# Patient Record
Sex: Female | Born: 1980 | Race: White | Hispanic: No | Marital: Single | Smoking: Never smoker
Health system: Southern US, Community
[De-identification: ages and names within clinical notes are randomized; demographics above are authoritative.]

## PROBLEM LIST (undated history)

## (undated) DIAGNOSIS — F419 Anxiety disorder, unspecified: Secondary | ICD-10-CM

---
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
## 1998-01-28 ENCOUNTER — Emergency Department (HOSPITAL_COMMUNITY): Admission: EM | Admit: 1998-01-28 | Discharge: 1998-01-28 | Payer: Self-pay | Admitting: Emergency Medicine

## 1998-02-05 ENCOUNTER — Emergency Department (HOSPITAL_COMMUNITY): Admission: EM | Admit: 1998-02-05 | Discharge: 1998-02-05 | Payer: Self-pay | Admitting: Emergency Medicine

## 1998-10-26 ENCOUNTER — Emergency Department (HOSPITAL_COMMUNITY): Admission: EM | Admit: 1998-10-26 | Discharge: 1998-10-26 | Payer: Self-pay | Admitting: Emergency Medicine

## 1998-12-30 ENCOUNTER — Emergency Department (HOSPITAL_COMMUNITY): Admission: EM | Admit: 1998-12-30 | Discharge: 1998-12-30 | Payer: Self-pay | Admitting: Emergency Medicine

## 1998-12-30 ENCOUNTER — Encounter: Payer: Self-pay | Admitting: Emergency Medicine

## 1999-01-11 ENCOUNTER — Emergency Department (HOSPITAL_COMMUNITY): Admission: EM | Admit: 1999-01-11 | Discharge: 1999-01-11 | Payer: Self-pay | Admitting: Emergency Medicine

## 1999-02-12 ENCOUNTER — Emergency Department (HOSPITAL_COMMUNITY): Admission: EM | Admit: 1999-02-12 | Discharge: 1999-02-12 | Payer: Self-pay | Admitting: *Deleted

## 1999-02-16 ENCOUNTER — Emergency Department (HOSPITAL_COMMUNITY): Admission: EM | Admit: 1999-02-16 | Discharge: 1999-02-16 | Payer: Self-pay | Admitting: Emergency Medicine

## 1999-04-21 ENCOUNTER — Emergency Department (HOSPITAL_COMMUNITY): Admission: EM | Admit: 1999-04-21 | Discharge: 1999-04-21 | Payer: Self-pay | Admitting: Emergency Medicine

## 1999-04-23 ENCOUNTER — Emergency Department (HOSPITAL_COMMUNITY): Admission: EM | Admit: 1999-04-23 | Discharge: 1999-04-23 | Payer: Self-pay | Admitting: Emergency Medicine

## 1999-07-28 ENCOUNTER — Emergency Department (HOSPITAL_COMMUNITY): Admission: EM | Admit: 1999-07-28 | Discharge: 1999-07-28 | Payer: Self-pay | Admitting: Emergency Medicine

## 1999-08-28 ENCOUNTER — Emergency Department (HOSPITAL_COMMUNITY): Admission: EM | Admit: 1999-08-28 | Discharge: 1999-08-28 | Payer: Self-pay | Admitting: Emergency Medicine

## 1999-09-16 ENCOUNTER — Emergency Department (HOSPITAL_COMMUNITY): Admission: EM | Admit: 1999-09-16 | Discharge: 1999-09-16 | Payer: Self-pay | Admitting: Emergency Medicine

## 2000-04-16 ENCOUNTER — Emergency Department (HOSPITAL_COMMUNITY): Admission: EM | Admit: 2000-04-16 | Discharge: 2000-04-16 | Payer: Self-pay | Admitting: Emergency Medicine

## 2000-04-16 ENCOUNTER — Encounter: Payer: Self-pay | Admitting: Emergency Medicine

## 2000-08-29 ENCOUNTER — Emergency Department (HOSPITAL_COMMUNITY): Admission: EM | Admit: 2000-08-29 | Discharge: 2000-08-29 | Payer: Self-pay | Admitting: Emergency Medicine

## 2000-12-01 ENCOUNTER — Other Ambulatory Visit: Admission: RE | Admit: 2000-12-01 | Discharge: 2000-12-01 | Payer: Self-pay | Admitting: Physician Assistant

## 2000-12-18 ENCOUNTER — Inpatient Hospital Stay (HOSPITAL_COMMUNITY): Admission: AD | Admit: 2000-12-18 | Discharge: 2000-12-18 | Payer: Self-pay | Admitting: Obstetrics & Gynecology

## 2001-02-15 ENCOUNTER — Ambulatory Visit (HOSPITAL_COMMUNITY): Admission: RE | Admit: 2001-02-15 | Discharge: 2001-02-15 | Payer: Self-pay | Admitting: *Deleted

## 2001-03-04 ENCOUNTER — Inpatient Hospital Stay (HOSPITAL_COMMUNITY): Admission: AD | Admit: 2001-03-04 | Discharge: 2001-03-04 | Payer: Self-pay | Admitting: Obstetrics & Gynecology

## 2001-04-25 ENCOUNTER — Inpatient Hospital Stay (HOSPITAL_COMMUNITY): Admission: AD | Admit: 2001-04-25 | Discharge: 2001-04-25 | Payer: Self-pay | Admitting: *Deleted

## 2001-04-28 ENCOUNTER — Inpatient Hospital Stay (HOSPITAL_COMMUNITY): Admission: AD | Admit: 2001-04-28 | Discharge: 2001-05-01 | Payer: Self-pay | Admitting: *Deleted

## 2001-04-28 ENCOUNTER — Ambulatory Visit (HOSPITAL_COMMUNITY): Admission: RE | Admit: 2001-04-28 | Discharge: 2001-04-28 | Payer: Self-pay | Admitting: *Deleted

## 2001-07-08 ENCOUNTER — Emergency Department (HOSPITAL_COMMUNITY): Admission: EM | Admit: 2001-07-08 | Discharge: 2001-07-08 | Payer: Self-pay | Admitting: Emergency Medicine

## 2001-08-19 ENCOUNTER — Emergency Department (HOSPITAL_COMMUNITY): Admission: EM | Admit: 2001-08-19 | Discharge: 2001-08-19 | Payer: Self-pay | Admitting: Emergency Medicine

## 2001-09-23 ENCOUNTER — Emergency Department (HOSPITAL_COMMUNITY): Admission: EM | Admit: 2001-09-23 | Discharge: 2001-09-23 | Payer: Self-pay | Admitting: Emergency Medicine

## 2001-10-07 ENCOUNTER — Emergency Department (HOSPITAL_COMMUNITY): Admission: EM | Admit: 2001-10-07 | Discharge: 2001-10-08 | Payer: Self-pay | Admitting: Emergency Medicine

## 2002-01-01 ENCOUNTER — Emergency Department (HOSPITAL_COMMUNITY): Admission: EM | Admit: 2002-01-01 | Discharge: 2002-01-01 | Payer: Self-pay | Admitting: *Deleted

## 2005-04-26 ENCOUNTER — Emergency Department (HOSPITAL_COMMUNITY): Admission: EM | Admit: 2005-04-26 | Discharge: 2005-04-26 | Payer: Self-pay | Admitting: Emergency Medicine

## 2005-04-27 ENCOUNTER — Emergency Department (HOSPITAL_COMMUNITY): Admission: EM | Admit: 2005-04-27 | Discharge: 2005-04-27 | Payer: Self-pay | Admitting: Emergency Medicine

## 2005-05-01 ENCOUNTER — Emergency Department (HOSPITAL_COMMUNITY): Admission: EM | Admit: 2005-05-01 | Discharge: 2005-05-02 | Payer: Self-pay | Admitting: Emergency Medicine

## 2005-06-07 ENCOUNTER — Inpatient Hospital Stay (HOSPITAL_COMMUNITY): Admission: AD | Admit: 2005-06-07 | Discharge: 2005-06-07 | Payer: Self-pay | Admitting: Obstetrics & Gynecology

## 2005-08-13 ENCOUNTER — Inpatient Hospital Stay (HOSPITAL_COMMUNITY): Admission: AD | Admit: 2005-08-13 | Discharge: 2005-08-13 | Payer: Self-pay | Admitting: Obstetrics

## 2005-08-14 ENCOUNTER — Inpatient Hospital Stay (HOSPITAL_COMMUNITY): Admission: AD | Admit: 2005-08-14 | Discharge: 2005-08-14 | Payer: Self-pay | Admitting: Obstetrics & Gynecology

## 2005-09-21 ENCOUNTER — Inpatient Hospital Stay (HOSPITAL_COMMUNITY): Admission: AD | Admit: 2005-09-21 | Discharge: 2005-09-22 | Payer: Self-pay | Admitting: Obstetrics & Gynecology

## 2005-09-22 ENCOUNTER — Emergency Department (HOSPITAL_COMMUNITY): Admission: EM | Admit: 2005-09-22 | Discharge: 2005-09-22 | Payer: Self-pay | Admitting: Emergency Medicine

## 2005-10-22 ENCOUNTER — Inpatient Hospital Stay (HOSPITAL_COMMUNITY): Admission: AD | Admit: 2005-10-22 | Discharge: 2005-10-22 | Payer: Self-pay | Admitting: Obstetrics & Gynecology

## 2005-12-02 ENCOUNTER — Ambulatory Visit (HOSPITAL_COMMUNITY): Admission: RE | Admit: 2005-12-02 | Discharge: 2005-12-02 | Payer: Self-pay | Admitting: Obstetrics

## 2005-12-11 ENCOUNTER — Inpatient Hospital Stay (HOSPITAL_COMMUNITY): Admission: AD | Admit: 2005-12-11 | Discharge: 2005-12-11 | Payer: Self-pay | Admitting: Obstetrics

## 2006-01-12 ENCOUNTER — Inpatient Hospital Stay (HOSPITAL_COMMUNITY): Admission: AD | Admit: 2006-01-12 | Discharge: 2006-01-12 | Payer: Self-pay | Admitting: Obstetrics

## 2006-03-17 ENCOUNTER — Inpatient Hospital Stay (HOSPITAL_COMMUNITY): Admission: AD | Admit: 2006-03-17 | Discharge: 2006-03-17 | Payer: Self-pay | Admitting: Obstetrics

## 2006-03-26 ENCOUNTER — Inpatient Hospital Stay (HOSPITAL_COMMUNITY): Admission: AD | Admit: 2006-03-26 | Discharge: 2006-03-26 | Payer: Self-pay | Admitting: Obstetrics

## 2006-04-05 ENCOUNTER — Inpatient Hospital Stay (HOSPITAL_COMMUNITY): Admission: AD | Admit: 2006-04-05 | Discharge: 2006-04-08 | Payer: Self-pay | Admitting: Obstetrics & Gynecology

## 2006-06-10 ENCOUNTER — Emergency Department (HOSPITAL_COMMUNITY): Admission: EM | Admit: 2006-06-10 | Discharge: 2006-06-10 | Payer: Self-pay | Admitting: Emergency Medicine

## 2006-08-10 ENCOUNTER — Inpatient Hospital Stay (HOSPITAL_COMMUNITY): Admission: AD | Admit: 2006-08-10 | Discharge: 2006-08-10 | Payer: Self-pay | Admitting: Obstetrics

## 2006-10-18 ENCOUNTER — Inpatient Hospital Stay (HOSPITAL_COMMUNITY): Admission: AD | Admit: 2006-10-18 | Discharge: 2006-10-18 | Payer: Self-pay | Admitting: Obstetrics and Gynecology

## 2006-10-22 ENCOUNTER — Inpatient Hospital Stay (HOSPITAL_COMMUNITY): Admission: AD | Admit: 2006-10-22 | Discharge: 2006-10-22 | Payer: Self-pay | Admitting: Gynecology

## 2009-04-15 ENCOUNTER — Emergency Department (HOSPITAL_COMMUNITY): Admission: EM | Admit: 2009-04-15 | Discharge: 2009-04-16 | Payer: Self-pay | Admitting: Emergency Medicine

## 2010-05-24 ENCOUNTER — Emergency Department (HOSPITAL_BASED_OUTPATIENT_CLINIC_OR_DEPARTMENT_OTHER)
Admission: EM | Admit: 2010-05-24 | Discharge: 2010-05-24 | Disposition: A | Discharge status: Home / Self Care | Payer: Medicaid Other | Attending: Emergency Medicine | Admitting: Emergency Medicine

## 2010-05-24 DIAGNOSIS — J029 Acute pharyngitis, unspecified: Secondary | ICD-10-CM | POA: Insufficient documentation

## 2010-05-24 DIAGNOSIS — R059 Cough, unspecified: Secondary | ICD-10-CM | POA: Insufficient documentation

## 2010-05-24 DIAGNOSIS — R05 Cough: Secondary | ICD-10-CM | POA: Insufficient documentation

## 2010-06-06 LAB — CBC
Hemoglobin: 13.7 g/dL (ref 12.0–15.0)
Platelets: 234 10*3/uL (ref 150–400)

## 2010-06-06 LAB — BASIC METABOLIC PANEL
BUN: 6 mg/dL (ref 6–23)
CO2: 24 mEq/L (ref 19–32)
Chloride: 103 mEq/L (ref 96–112)
GFR calc Af Amer: >60 mL/min (ref >60)
Glucose, Bld: 121 mg/dL — ABNORMAL HIGH (ref 70–99)
Potassium: 3.4 mEq/L — ABNORMAL LOW (ref 3.5–5.1)

## 2010-06-06 LAB — DIFFERENTIAL
Basophils Absolute: 0 10*3/uL (ref 0.0–0.1)
Eosinophils Absolute: 0 10*3/uL (ref 0.0–0.7)
Lymphocytes Relative: 7 % — ABNORMAL LOW (ref 12–46)
Neutro Abs: 13.2 10*3/uL — ABNORMAL HIGH (ref 1.7–7.7)
Neutrophils Relative %: 87 % — ABNORMAL HIGH (ref 43–77)

## 2010-06-06 LAB — HEPATIC FUNCTION PANEL
AST: 73 U/L — ABNORMAL HIGH (ref 0–37)
Albumin: 3.9 g/dL (ref 3.5–5.2)
Bilirubin, Direct: 0.2 mg/dL (ref 0.0–0.3)
Indirect Bilirubin: 0.5 mg/dL (ref 0.3–0.9)

## 2010-06-06 LAB — LIPASE, BLOOD: Lipase: 23 U/L (ref 11–59)

## 2010-06-06 LAB — POCT CARDIAC MARKERS

## 2010-08-01 NOTE — H&P (Signed)
NAMEDEVIKA, DRAGOVICH                 ACCOUNT NO.:  [id number]   MEDICAL RECORD NO.:  [id number]          PATIENT TYPE:  INP   LOCATION:  9164                          FACILITY:  [facility]   PHYSICIAN:  Roseanna Rainbow, M.D.DATE OF BIRTH:  08-Sep-1980   DATE OF ADMISSION:  04/05/2006  DATE OF DISCHARGE:                              HISTORY & PHYSICAL   CHIEF COMPLAINT:  The patient is a 30 year old, gravida 4, para 2 with  an estimated date of confinement of January 28 with an intrauterine  pregnancy at 39 weeks complaining of uterine contractions.   HISTORY OF PRESENT ILLNESS:  Please see the above.   ALLERGIES:  NO KNOWN DRUG ALLERGIES.   MEDICATIONS:  Please see the reconciliation form.   OB RISK FACTORS:  Urinary tract infection, threatened preterm labor.   PRENATAL LABS:  Hematocrit 32.7, hemoglobin 11, platelets 211,000.  Chlamydia probe negative, GC probe negative, urine culture and  sensitivity no growth in June of 2007, one-hour GCT 103, hepatitis B  surface antigen negative, HIV negative, blood type O positive, antibody  screen negative, RPR nonreactive, rubella immune.  GBS is negative on  January 9th.   PAST GYN HISTORY:  Chlamydia, recurrent urinary tract infections.   PAST MEDICAL HISTORY:  Please see the above, migraine headaches.  She  has a history of anxiety disorder.   PAST SURGICAL HISTORY:  She denies.   SOCIAL HISTORY:  She is a Child psychotherapist, single.  Does not give any  significant history of alcohol use.  She has no significant smoking  history.  Denies illicit drug use.   FAMILY HISTORY:  Chronic alcoholism.   PAST OBSTETRICAL HISTORY:  In February of 2003, she was delivered of a  liveborn female, 8 pounds 2 ounces, vaginal delivery, no complications.  In May of 1999, she was delivered a liveborn female, 6 pounds 14 ounces,  vaginal delivery, no complications.  There is a history of one  miscarriage or abortion.  There is a history of a voluntary  termination  of pregnancy.   PHYSICAL EXAMINATION:  VITAL SIGNS:  Temperature 97.9, pulse 103,  respiratory rate 20, blood pressure 131/56.  GENERAL:  Well developed, well nourished, in no apparent distress.  Fetal heart tracing reassuring.  Tocodynamometer irregular uterine  contractions.  PELVIC:  Sterile vaginal exam per the RN.  Cervix is 4 cm dilated, 50%  effaced.   ASSESSMENT:  Multipara at term, likely latent phase of labor.  Fetal  heart tracing consistent with fetal well being.   PLAN:  Admission, expectant management.      Roseanna Rainbow, M.D.  Electronically Signed     LAJ/MEDQ  D:  04/05/2006  T:  04/05/2006  Job:  [id number]

## 2010-09-02 ENCOUNTER — Emergency Department (HOSPITAL_BASED_OUTPATIENT_CLINIC_OR_DEPARTMENT_OTHER)
Admission: EM | Admit: 2010-09-02 | Discharge: 2010-09-02 | Disposition: A | Discharge status: Home / Self Care | Payer: Medicaid Other | Attending: Emergency Medicine | Admitting: Emergency Medicine

## 2010-09-02 DIAGNOSIS — B9789 Other viral agents as the cause of diseases classified elsewhere: Secondary | ICD-10-CM | POA: Insufficient documentation

## 2010-09-02 DIAGNOSIS — B084 Enteroviral vesicular stomatitis with exanthem: Secondary | ICD-10-CM | POA: Insufficient documentation

## 2010-12-18 ENCOUNTER — Emergency Department (HOSPITAL_BASED_OUTPATIENT_CLINIC_OR_DEPARTMENT_OTHER)
Admission: EM | Admit: 2010-12-18 | Discharge: 2010-12-19 | Disposition: A | Discharge status: Home / Self Care | Payer: Medicaid Other | Attending: Emergency Medicine | Admitting: Emergency Medicine

## 2010-12-18 ENCOUNTER — Encounter: Payer: Self-pay | Admitting: Emergency Medicine

## 2010-12-18 ENCOUNTER — Emergency Department (INDEPENDENT_AMBULATORY_CARE_PROVIDER_SITE_OTHER): Payer: Medicaid Other

## 2010-12-18 DIAGNOSIS — R05 Cough: Secondary | ICD-10-CM

## 2010-12-18 DIAGNOSIS — J3489 Other specified disorders of nose and nasal sinuses: Secondary | ICD-10-CM

## 2010-12-18 DIAGNOSIS — R059 Cough, unspecified: Secondary | ICD-10-CM | POA: Insufficient documentation

## 2010-12-18 DIAGNOSIS — R0609 Other forms of dyspnea: Secondary | ICD-10-CM

## 2010-12-18 DIAGNOSIS — J069 Acute upper respiratory infection, unspecified: Secondary | ICD-10-CM | POA: Insufficient documentation

## 2010-12-18 MED ORDER — ACETAMINOPHEN-CODEINE 120-12 MG/5ML PO SUSP: 5.0000 mL | Freq: Four times a day (QID) | ORAL | Status: AC | PRN

## 2010-12-18 MED ORDER — IBUPROFEN 800 MG PO TABS: 800.0000 mg | ORAL_TABLET | Freq: Three times a day (TID) | ORAL | Status: AC

## 2010-12-18 MED ORDER — ALBUTEROL SULFATE HFA 108 (90 BASE) MCG/ACT IN AERS: 1.0000 | INHALATION_SPRAY | Freq: Four times a day (QID) | RESPIRATORY_TRACT | Status: DC | PRN

## 2010-12-18 MED ORDER — ALBUTEROL SULFATE HFA 108 (90 BASE) MCG/ACT IN AERS
2.0000 | INHALATION_SPRAY | Freq: Once | RESPIRATORY_TRACT | Status: AC
  Administered 2010-12-18: 2 via RESPIRATORY_TRACT

## 2010-12-18 MED ORDER — ALBUTEROL SULFATE HFA 108 (90 BASE) MCG/ACT IN AERS
INHALATION_SPRAY | RESPIRATORY_TRACT | Status: AC
  Administered 2010-12-18: 2 via RESPIRATORY_TRACT
  Filled 2010-12-18: qty 6.7

## 2010-12-18 NOTE — ED Provider Notes (Signed)
History     CSN: [id number] Arrival date & time: 12/18/2010  9:48 PM  Chief Complaint  Patient presents with  . Cough  . Nasal Congestion    (Consider location/radiation/quality/duration/timing/severity/associated sxs/prior treatment) Patient is a 30 y.o. female presenting with cough. The history is provided by the patient.  Cough This is a new problem. Associated symptoms include sore throat. Pertinent negatives include no chest pain, no chills, no headaches, no shortness of breath and no wheezing.   dry cough started 2-3 days ago with no sick contacts at home. No fevers or chills. Does have some congestion and and mild sore throat. No chest pain no shortness of breath location is in her chest no radiation. Timing has been continuous and worse at night causing some difficulty sleeping that's why she presents here. Severity is moderate no associated rash. no recent travel.   Past Medical History  Diagnosis Date  . Pneumonia     History reviewed. No pertinent past surgical history.  History reviewed. No pertinent family history.  History  Substance Use Topics  . Smoking status: Former Games developer  . Smokeless tobacco: Not on file  . Alcohol Use: No    OB History    Grav Para Term Preterm Abortions TAB SAB Ect Mult Living                  Review of Systems  Constitutional: Negative for fever and chills.  HENT: Positive for congestion and sore throat. Negative for drooling, trouble swallowing, neck pain, neck stiffness and voice change.   Eyes: Negative for pain.  Respiratory: Positive for cough. Negative for shortness of breath and wheezing.   Cardiovascular: Negative for chest pain.  Gastrointestinal: Negative for abdominal pain.  Genitourinary: Negative for dysuria.  Musculoskeletal: Negative for back pain.  Skin: Negative for rash.  Neurological: Negative for headaches.  All other systems reviewed and are negative.    Allergies  Review of patient's allergies  indicates no known allergies.  Home Medications   Current Outpatient Rx  Name Route Sig Dispense Refill  . ACETAMINOPHEN 500 MG PO TABS Oral Take 1,000 mg by mouth every 6 (six) hours as needed. For pain     . PSEUDOEPH-DOXYLAMINE-DM-APAP 60-7.08-12-998 MG/30ML PO LIQD Oral Take 25 mLs by mouth at bedtime as needed. For cold       BP 106/75  Pulse 91  Temp(Src) 97.8 F (36.6 C) (Oral)  Resp 18  SpO2 100%  LMP 12/17/2010  Physical Exam  Constitutional: She is oriented to person, place, and time. She appears well-developed and well-nourished.  HENT:  Head: Normocephalic and atraumatic.  Eyes: Conjunctivae and EOM are normal. Pupils are equal, round, and reactive to light.       No tonsillar enlargement or exudates, uvula midline  Neck: Trachea normal. Neck supple. No thyromegaly present.  Cardiovascular: Normal rate, regular rhythm, S1 normal, S2 normal and normal pulses.     No systolic murmur is present   No diastolic murmur is present  Pulses:      Radial pulses are 2+ on the right side, and 2+ on the left side.  Pulmonary/Chest: Effort normal and breath sounds normal. She has no wheezes. She has no rhonchi. She has no rales. She exhibits no tenderness.       Good air movement no respiratory distress.  Abdominal: Soft. Normal appearance and bowel sounds are normal. There is no tenderness. There is no CVA tenderness and negative Murphy's sign.  Musculoskeletal:  BLE:s Calves nontender, no cords or erythema, negative Homans sign  Neurological: She is alert and oriented to person, place, and time. She has normal strength. No cranial nerve deficit or sensory deficit. GCS eye subscore is 4. GCS verbal subscore is 5. GCS motor subscore is 6.  Skin: Skin is warm and dry. No rash noted. She is not diaphoretic.  Psychiatric: Her speech is normal.       Cooperative and appropriate    ED Course  Procedures (including critical care time)  Labs Reviewed - No data to display Dg  Chest 2 View  12/18/2010  *RADIOLOGY REPORT*  Clinical Data: Cough and nasal congestion with difficulty breathing for 1 week.  CHEST - 2 VIEW  Comparison: 04/16/2009  Findings: Normal heart size and pulmonary vascularity.  No focal airspace consolidation in the lungs.  No blunting of costophrenic angles.  No pneumothorax.  Mild thoracolumbar scoliosis.  No significant changes since the previous study.  IMPRESSION: No evidence of active pulmonary disease.  No significant interval change.  Original Report Authenticated By: Marlon Pel, M.D.     No diagnosis found.    MDM   Presentation consistent with viral URI. Patient was given albuterol 2 puffs which did seem to help her symptoms some so she was sent home with an inhaler. Chest x-ray was reviewed as above no pneumonia. Prescription for cough and high-dose anti-inflammatories and plan the primary care followup as needed.        Sunnie Nielsen, MD 12/18/10 2350

## 2010-12-18 NOTE — ED Notes (Signed)
Pt reports cough and congestion since mon. Pt c/o shob over last day

## 2010-12-19 NOTE — ED Notes (Signed)
Care plan and use of meds reviewed pt verbalizes plan well

## 2010-12-29 LAB — URINALYSIS, ROUTINE W REFLEX MICROSCOPIC
Glucose, UA: NEGATIVE
Ketones, ur: 40 — AB
Ketones, ur: NEGATIVE
Leukocytes, UA: NEGATIVE
Nitrite: NEGATIVE
Protein, ur: NEGATIVE
Specific Gravity, Urine: >1.03 — ABNORMAL HIGH
Urobilinogen, UA: 0.2
pH: 6

## 2010-12-29 LAB — DIFFERENTIAL
Basophils Absolute: 0
Eosinophils Absolute: 0.1
Eosinophils Relative: 1
Lymphs Abs: 2
Monocytes Absolute: 0.5
Monocytes Relative: 4
Neutrophils Relative %: 77

## 2010-12-29 LAB — TYPE AND SCREEN: ABO/RH(D): O POS

## 2010-12-29 LAB — URINE MICROSCOPIC-ADD ON

## 2010-12-29 LAB — WET PREP, GENITAL
Trich, Wet Prep: NONE SEEN
Yeast Wet Prep HPF POC: NONE SEEN

## 2010-12-29 LAB — GC/CHLAMYDIA PROBE AMP, GENITAL
Chlamydia, DNA Probe: NEGATIVE
GC Probe Amp, Genital: POSITIVE — AB

## 2010-12-29 LAB — HIV ANTIBODY (ROUTINE TESTING W REFLEX): HIV: NONREACTIVE

## 2010-12-29 LAB — CBC
MCHC: 35
MCV: 90.8
RBC: 3.62 — ABNORMAL LOW
RDW: 14.6 — ABNORMAL HIGH
WBC: 11.9 — ABNORMAL HIGH

## 2011-06-10 ENCOUNTER — Encounter (HOSPITAL_BASED_OUTPATIENT_CLINIC_OR_DEPARTMENT_OTHER): Payer: Self-pay | Admitting: *Deleted

## 2011-06-10 ENCOUNTER — Emergency Department (HOSPITAL_BASED_OUTPATIENT_CLINIC_OR_DEPARTMENT_OTHER)
Admission: EM | Admit: 2011-06-10 | Discharge: 2011-06-10 | Disposition: A | Discharge status: Home / Self Care | Payer: Medicaid Other | Attending: Emergency Medicine | Admitting: Emergency Medicine

## 2011-06-10 DIAGNOSIS — R05 Cough: Secondary | ICD-10-CM | POA: Insufficient documentation

## 2011-06-10 DIAGNOSIS — R51 Headache: Secondary | ICD-10-CM | POA: Insufficient documentation

## 2011-06-10 DIAGNOSIS — J029 Acute pharyngitis, unspecified: Secondary | ICD-10-CM | POA: Insufficient documentation

## 2011-06-10 DIAGNOSIS — J069 Acute upper respiratory infection, unspecified: Secondary | ICD-10-CM | POA: Insufficient documentation

## 2011-06-10 DIAGNOSIS — J3489 Other specified disorders of nose and nasal sinuses: Secondary | ICD-10-CM | POA: Insufficient documentation

## 2011-06-10 DIAGNOSIS — R059 Cough, unspecified: Secondary | ICD-10-CM | POA: Insufficient documentation

## 2011-06-10 NOTE — ED Notes (Signed)
Pt amb to triage with quick steady gait in nad. Pt reports cough, congestion, headache x Saturday.

## 2011-06-10 NOTE — Discharge Instructions (Signed)

## 2011-06-10 NOTE — ED Provider Notes (Signed)
History     CSN: [id number]  Arrival date & time 06/10/11  1737   First MD Initiated Contact with Patient 06/10/11 1855      Chief Complaint  Patient presents with  . Cough  . Nasal Congestion    (Consider location/radiation/quality/duration/timing/severity/associated sxs/prior treatment) HPI Comments: Patient presents with cough and cold symptoms since Saturday.  She notes that she just feels like they're not getting better.  She's used Tylenol and ibuprofen for her associated headache.  No nausea and vomiting.  Patient notes a mild sore throat and congestion and sinus pressure.  No shortness of breath or wheezing.  Patient is a 31 y.o. female presenting with cough. The history is provided by the patient. No language interpreter was used.  Cough This is a new problem. The current episode started more than 2 days ago. The problem occurs every few minutes. The problem has not changed since onset.The cough is non-productive. There has been no fever. Associated symptoms include headaches, rhinorrhea and sore throat. Pertinent negatives include no chest pain, no chills, no shortness of breath and no eye redness.    Past Medical History  Diagnosis Date  . Pneumonia     History reviewed. No pertinent past surgical history.  History reviewed. No pertinent family history.  History  Substance Use Topics  . Smoking status: Former Games developer  . Smokeless tobacco: Not on file  . Alcohol Use: No    OB History    Grav Para Term Preterm Abortions TAB SAB Ect Mult Living                  Review of Systems  Constitutional: Negative.  Negative for fever and chills.  HENT: Positive for sore throat and rhinorrhea.   Eyes: Negative.  Negative for discharge and redness.  Respiratory: Positive for cough. Negative for shortness of breath.   Cardiovascular: Negative.  Negative for chest pain.  Gastrointestinal: Negative.  Negative for nausea, vomiting, abdominal pain and diarrhea.    Genitourinary: Negative.  Negative for dysuria and vaginal discharge.  Musculoskeletal: Negative.  Negative for back pain.  Skin: Negative.  Negative for color change and rash.  Neurological: Positive for headaches. Negative for syncope.  Hematological: Negative.  Negative for adenopathy.  Psychiatric/Behavioral: Negative.  Negative for confusion.  All other systems reviewed and are negative.    Allergies  Review of patient's allergies indicates no known allergies.  Home Medications   Current Outpatient Rx  Name Route Sig Dispense Refill  . ACETAMINOPHEN 500 MG PO TABS Oral Take 1,000 mg by mouth every 6 (six) hours as needed. For pain     . ALBUTEROL SULFATE HFA 108 (90 BASE) MCG/ACT IN AERS Inhalation Inhale 1-2 puffs into the lungs every 6 (six) hours as needed for wheezing. 1 Inhaler 0  . PSEUDOEPH-DOXYLAMINE-DM-APAP 60-7.08-12-998 MG/30ML PO LIQD Oral Take 25 mLs by mouth at bedtime as needed. For cold       BP 103/65  Pulse 95  Temp(Src) 98.1 F (36.7 C) (Oral)  Resp 18  Ht 5\' 3"  (1.6 m)  SpO2 100%  LMP 05/19/2011  Physical Exam  Nursing note and vitals reviewed. Constitutional: She is oriented to person, place, and time. She appears well-developed and well-nourished.  Non-toxic appearance. She does not have a sickly appearance.  HENT:  Head: Normocephalic and atraumatic.  Mouth/Throat: Oropharynx is clear and moist. No oropharyngeal exudate.  Eyes: Conjunctivae, EOM and lids are normal. Pupils are equal, round, and reactive to light. No scleral  icterus.  Neck: Trachea normal and normal range of motion. Neck supple.  Cardiovascular: Normal rate, regular rhythm and normal heart sounds.   Pulmonary/Chest: Effort normal and breath sounds normal. No respiratory distress. She has no wheezes. She has no rales.  Abdominal: Soft. Normal appearance. There is no tenderness. There is no rebound, no guarding and no CVA tenderness.  Musculoskeletal: Normal range of motion.   Lymphadenopathy:    She has no cervical adenopathy.  Neurological: She is alert and oriented to person, place, and time. She has normal strength.  Skin: Skin is warm, dry and intact. No rash noted.  Psychiatric: She has a normal mood and affect. Her behavior is normal. Judgment and thought content normal.    ED Course  Procedures (including critical care time)  Labs Reviewed - No data to display No results found.   No diagnosis found.    MDM  Patient with likely viral URI.  Her lungs are clear and show no indication of pneumonia.  Her throat is clear and shows no signs of erythema or swelling to suggest strep pharyngitis.  I've advised the patient to continue using Tylenol, or ibuprofen and over-the-counter cold medicines for her symptoms.        Nat Christen, MD 06/10/11 [phone]

## 2011-10-06 ENCOUNTER — Emergency Department (HOSPITAL_COMMUNITY): Admission: EM | Admit: 2011-10-06 | Discharge: 2011-10-06 | Payer: Self-pay

## 2011-12-28 ENCOUNTER — Emergency Department (HOSPITAL_BASED_OUTPATIENT_CLINIC_OR_DEPARTMENT_OTHER)
Admission: EM | Admit: 2011-12-28 | Discharge: 2011-12-28 | Disposition: A | Discharge status: Home / Self Care | Payer: Medicaid Other | Attending: Emergency Medicine | Admitting: Emergency Medicine

## 2011-12-28 ENCOUNTER — Encounter (HOSPITAL_BASED_OUTPATIENT_CLINIC_OR_DEPARTMENT_OTHER): Payer: Self-pay | Admitting: Emergency Medicine

## 2011-12-28 DIAGNOSIS — Z87891 Personal history of nicotine dependence: Secondary | ICD-10-CM | POA: Insufficient documentation

## 2011-12-28 DIAGNOSIS — R109 Unspecified abdominal pain: Secondary | ICD-10-CM

## 2011-12-28 LAB — URINALYSIS, ROUTINE W REFLEX MICROSCOPIC
Glucose, UA: NEGATIVE mg/dL
Hgb urine dipstick: NEGATIVE
Ketones, ur: NEGATIVE mg/dL
pH: 5.5 (ref 5.0–8.0)

## 2011-12-28 LAB — WET PREP, GENITAL: Trich, Wet Prep: NONE SEEN

## 2011-12-28 LAB — PREGNANCY, URINE: Preg Test, Ur: NEGATIVE

## 2011-12-28 NOTE — ED Provider Notes (Signed)
History     CSN: [id number]  Arrival date & time 12/28/11  2136   First MD Initiated Contact with Patient 12/28/11 2150      Chief Complaint  Patient presents with  . Abdominal Pain    (Consider location/radiation/quality/duration/timing/severity/associated sxs/prior treatment) Patient is a 31 y.o. female presenting with abdominal pain. The history is provided by the patient and a parent.  Abdominal Pain The primary symptoms of the illness include abdominal pain and nausea. The primary symptoms of the illness do not include fever, shortness of breath, vomiting, dysuria or vaginal discharge. The current episode started more than 2 days ago.  The patient has not had a change in bowel habit. Symptoms associated with the illness do not include back pain. Associated symptoms comments: She reports lower abdominal pain on and off for two weeks. Just before the onset of discomfort, she started taking birth control pills and stopped after 4 days because of side effects that she did not like. No fever. She has mild nausea. No fever, no dysuria. She has had two episodes of brief vaginal bleeding during the last two weeks..    Past Medical History  Diagnosis Date  . Pneumonia     History reviewed. No pertinent past surgical history.  No family history on file.  History  Substance Use Topics  . Smoking status: Former Games developer  . Smokeless tobacco: Not on file  . Alcohol Use: No    OB History    Grav Para Term Preterm Abortions TAB SAB Ect Mult Living                  Review of Systems  Constitutional: Negative for fever.  Respiratory: Negative for shortness of breath.   Cardiovascular: Negative for chest pain.  Gastrointestinal: Positive for nausea and abdominal pain. Negative for vomiting.  Genitourinary: Negative for dysuria, flank pain and vaginal discharge.  Musculoskeletal: Negative for back pain.    Allergies  Review of patient's allergies indicates no known  allergies.  Home Medications   Current Outpatient Rx  Name Route Sig Dispense Refill  . ACETAMINOPHEN 500 MG PO TABS Oral Take 1,000 mg by mouth every 6 (six) hours as needed. For pain    . IBUPROFEN 200 MG PO TABS Oral Take 400 mg by mouth every 6 (six) hours as needed. For pain      BP 112/76  Pulse 100  Temp 97.9 F (36.6 C) (Oral)  Resp 18  Ht 5\' 3"  (1.6 m)  Wt 137 lb (62.143 kg)  BMI 24.27 kg/m2  SpO2 100%  Physical Exam  Constitutional: She appears well-developed and well-nourished.  HENT:  Head: Normocephalic.  Neck: Normal range of motion. Neck supple.  Cardiovascular: Normal rate.   Pulmonary/Chest: Effort normal.  Abdominal: Soft. Bowel sounds are normal. There is no tenderness. There is no rebound and no guarding.  Genitourinary: Vagina normal and uterus normal. No vaginal discharge found.       Mild generalized pelvic pain on bimanual exam. No masses.   Musculoskeletal: Normal range of motion.  Neurological: She is alert. No cranial nerve deficit.  Skin: Skin is warm and dry. No rash noted.  Psychiatric: She has a normal mood and affect.    ED Course  Procedures (including critical care time)   Labs Reviewed  URINALYSIS, ROUTINE W REFLEX MICROSCOPIC  PREGNANCY, URINE  WET PREP, GENITAL  GC/CHLAMYDIA PROBE AMP, GENITAL   Results for orders placed during the hospital encounter of 12/28/11  URINALYSIS, ROUTINE  W REFLEX MICROSCOPIC      Component Value Range   Color, Urine YELLOW  YELLOW   APPearance CLEAR  CLEAR   Specific Gravity, Urine 1.019  1.005 - 1.030   pH 5.5  5.0 - 8.0   Glucose, UA NEGATIVE  NEGATIVE mg/dL   Hgb urine dipstick NEGATIVE  NEGATIVE   Bilirubin Urine NEGATIVE  NEGATIVE   Ketones, ur NEGATIVE  NEGATIVE mg/dL   Protein, ur NEGATIVE  NEGATIVE mg/dL   Urobilinogen, UA 1.0  0.0 - 1.0 mg/dL   Nitrite NEGATIVE  NEGATIVE   Leukocytes, UA TRACE (*) NEGATIVE  PREGNANCY, URINE      Component Value Range   Preg Test, Ur NEGATIVE   NEGATIVE  WET PREP, GENITAL      Component Value Range   Yeast Wet Prep HPF POC NONE SEEN  NONE SEEN   Trich, Wet Prep NONE SEEN  NONE SEEN   Clue Cells Wet Prep HPF POC FEW (*) NONE SEEN   WBC, Wet Prep HPF POC FEW (*) NONE SEEN  URINE MICROSCOPIC-ADD ON      Component Value Range   Squamous Epithelial / LPF RARE  RARE   WBC, UA 0-2  <3 WBC/hpf   Bacteria, UA RARE  RARE    No results found.   No diagnosis found.   1. Abdominal pain  MDM  Benign exam, no cervical discharge, normal labs, VSS. Recent history of birth control pill with premature cessation. Feel she is stable for discharge.        Rodena Medin, PA-C 12/28/11 2248

## 2011-12-28 NOTE — ED Provider Notes (Signed)
Medical screening examination/treatment/procedure(s) were performed by non-physician practitioner and as supervising physician I was immediately available for consultation/collaboration.  Geoffery Lyons, MD 12/28/11 2249

## 2011-12-28 NOTE — ED Notes (Signed)
Onset x 2 weeks .  Low abd pain, dizziness,decreased appetite, nausea

## 2011-12-29 LAB — GC/CHLAMYDIA PROBE AMP, GENITAL
Chlamydia, DNA Probe: NEGATIVE
GC Probe Amp, Genital: NEGATIVE

## 2013-02-13 ENCOUNTER — Encounter (HOSPITAL_BASED_OUTPATIENT_CLINIC_OR_DEPARTMENT_OTHER): Payer: Self-pay | Admitting: Emergency Medicine

## 2013-02-13 ENCOUNTER — Emergency Department (HOSPITAL_BASED_OUTPATIENT_CLINIC_OR_DEPARTMENT_OTHER)
Admission: EM | Admit: 2013-02-13 | Discharge: 2013-02-13 | Disposition: A | Discharge status: Home / Self Care | Payer: Medicaid Other | Attending: Emergency Medicine | Admitting: Emergency Medicine

## 2013-02-13 DIAGNOSIS — R4589 Other symptoms and signs involving emotional state: Secondary | ICD-10-CM | POA: Insufficient documentation

## 2013-02-13 DIAGNOSIS — F410 Panic disorder [episodic paroxysmal anxiety] without agoraphobia: Secondary | ICD-10-CM

## 2013-02-13 DIAGNOSIS — R000 Tachycardia, unspecified: Secondary | ICD-10-CM | POA: Insufficient documentation

## 2013-02-13 DIAGNOSIS — G479 Sleep disorder, unspecified: Secondary | ICD-10-CM | POA: Insufficient documentation

## 2013-02-13 DIAGNOSIS — Z87891 Personal history of nicotine dependence: Secondary | ICD-10-CM | POA: Insufficient documentation

## 2013-02-13 DIAGNOSIS — N898 Other specified noninflammatory disorders of vagina: Secondary | ICD-10-CM | POA: Insufficient documentation

## 2013-02-13 DIAGNOSIS — J029 Acute pharyngitis, unspecified: Secondary | ICD-10-CM | POA: Insufficient documentation

## 2013-02-13 HISTORY — DX: Anxiety disorder, unspecified: F41.9

## 2013-02-13 MED ORDER — HYDROXYZINE HCL 25 MG PO TABS
ORAL_TABLET | ORAL | Status: AC
  Administered 2013-02-13: 25 mg via ORAL
  Filled 2013-02-13: qty 1

## 2013-02-13 MED ORDER — HYDROXYZINE HCL 25 MG PO TABS
25.0000 mg | ORAL_TABLET | Freq: Once | ORAL | Status: AC
  Administered 2013-02-13: 25 mg via ORAL
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
## 2013-02-13 NOTE — ED Notes (Signed)
Pt reports panic attack.  Pt tearful.  Reports extra stress recently.  Denies pain

## 2013-02-19 NOTE — ED Provider Notes (Signed)
Medical screening examination/treatment/procedure(s) were performed by non-physician practitioner and as supervising physician I was immediately available for consultation/collaboration.  EKG Interpretation   None        Hazle Ogburn K Linker, MD 02/19/13 0704 

## 2013-03-21 ENCOUNTER — Emergency Department (HOSPITAL_BASED_OUTPATIENT_CLINIC_OR_DEPARTMENT_OTHER)
Admission: EM | Admit: 2013-03-21 | Discharge: 2013-03-21 | Disposition: A | Discharge status: Home / Self Care | Payer: Medicaid Other | Attending: Emergency Medicine | Admitting: Emergency Medicine

## 2013-03-21 ENCOUNTER — Encounter (HOSPITAL_BASED_OUTPATIENT_CLINIC_OR_DEPARTMENT_OTHER): Payer: Self-pay | Admitting: Emergency Medicine

## 2013-03-21 DIAGNOSIS — Z87891 Personal history of nicotine dependence: Secondary | ICD-10-CM | POA: Insufficient documentation

## 2013-03-21 DIAGNOSIS — M542 Cervicalgia: Secondary | ICD-10-CM | POA: Insufficient documentation

## 2013-03-21 DIAGNOSIS — Z8659 Personal history of other mental and behavioral disorders: Secondary | ICD-10-CM | POA: Insufficient documentation

## 2013-03-21 DIAGNOSIS — I889 Nonspecific lymphadenitis, unspecified: Secondary | ICD-10-CM

## 2013-03-21 MED ORDER — AZITHROMYCIN 250 MG PO TABS: ORAL_TABLET | ORAL | Status: DC

## 2013-03-21 NOTE — ED Notes (Addendum)
Neck pain since yesterday. States she was scratched by a cat a week ago by a cat with cat scratch fever

## 2013-03-21 NOTE — ED Provider Notes (Signed)
CSN: [id number]     Arrival date & time 03/21/13  1811 History   First MD Initiated Contact with Patient 03/21/13 2031     This chart was scribed for Rolan BuccoMelanie Glyn Gerads, MD by Arlan OrganAshley Leger, ED Scribe. This patient was seen in room MH07/MH07 and the patient's care was started 9:11 PM.   Chief Complaint  Patient presents with  . Sore Throat   Patient is a 33 y.o. female presenting with pharyngitis. The history is provided by the patient. No language interpreter was used.  Sore Throat This is a new problem. The current episode started yesterday. The problem occurs constantly. The problem has been gradually worsening. Pertinent negatives include no chest pain, no abdominal pain, no headaches and no shortness of breath.    HPI Comments: Deanna Walton is a 33 y.o. female who presents to the Emergency Department complaining of gradual onset, unchanged, constant, moderate left sided neck pain with radiating pain to her left ear that first started yesterday. She also notes mild swelling to the area onset today she describes as "pressure". She states the area itches and burns. Pt states she was recently scratched by a cat a week ago, but denies any other injury or trauma. She states that the same cat scratched her caused another person to get cat scratch fever. She's felt feverish but hasn't checked her temperature. Denies rash. She denies fever, dysphagia, congestion, or rhinorrhea. She denies any pain underneath her tongue. Pt has a h/o anxiety.  Past Medical History  Diagnosis Date  . Anxiety    History reviewed. No pertinent past surgical history. No family history on file. History  Substance Use Topics  . Smoking status: Former Games developermoker  . Smokeless tobacco: Not on file  . Alcohol Use: No   OB History   Grav Para Term Preterm Abortions TAB SAB Ect Mult Living                 Review of Systems  Constitutional: Negative for fever, chills, diaphoresis and fatigue.  HENT: Negative for congestion,  rhinorrhea, sneezing and sore throat.   Eyes: Negative.   Respiratory: Negative for cough, chest tightness and shortness of breath.   Cardiovascular: Negative for chest pain and leg swelling.  Gastrointestinal: Negative for nausea, vomiting, abdominal pain, diarrhea and blood in stool.  Genitourinary: Negative for frequency, hematuria, flank pain and difficulty urinating.  Musculoskeletal: Positive for neck pain. Negative for arthralgias and back pain.  Skin: Negative for rash.  Neurological: Negative for dizziness, speech difficulty, weakness, numbness and headaches.    Allergies  Review of patient's allergies indicates no known allergies.  Home Medications   Current Outpatient Rx  Name  Route  Sig  Dispense  Refill  . acetaminophen (TYLENOL) 500 MG tablet   Oral   Take 1,000 mg by mouth every 6 (six) hours as needed. For pain         . azithromycin (ZITHROMAX Z-PAK) 250 MG tablet      2 po day one, then 1 daily x 4 days   5 tablet   0   . hydrOXYzine (ATARAX/VISTARIL) 25 MG tablet   Oral   Take 25 mg by mouth 3 (three) times daily as needed.         Marland Kitchen. ibuprofen (ADVIL,MOTRIN) 200 MG tablet   Oral   Take 400 mg by mouth every 6 (six) hours as needed. For pain          Triage Vitals: BP 110/75  Pulse 72  Temp(Src) 98.3 F (36.8 C) (Oral)  Resp 16  Ht 5' 3.25" (1.607 m)  Wt 147 lb (66.679 kg)  BMI 25.82 kg/m2  SpO2 100%  Physical Exam  Constitutional: She is oriented to person, place, and time. She appears well-developed and well-nourished.  HENT:  Head: Normocephalic and atraumatic.  Right Ear: External ear normal.  Left Ear: External ear normal.  Mouth/Throat: Oropharynx is clear and moist.  Eyes: Pupils are equal, round, and reactive to light.  Neck: Normal range of motion. Neck supple.  Patient has some tenderness along the cervical lymph nodes, anteriorly. I do not see any rashes. There's no swelling to the neck. There is no masses palpated. No pain  under the tongue or elevation of the tongue. No trismus.  Cardiovascular: Normal rate, regular rhythm and normal heart sounds.   Pulmonary/Chest: Effort normal and breath sounds normal. No respiratory distress. She has no wheezes. She has no rales. She exhibits no tenderness.  Abdominal: Soft. Bowel sounds are normal. There is no tenderness. There is no rebound and no guarding.  Musculoskeletal: Normal range of motion. She exhibits no edema.  Lymphadenopathy:    She has cervical adenopathy.  Neurological: She is alert and oriented to person, place, and time.  Skin: Skin is warm and dry. No rash noted.  Psychiatric: She has a normal mood and affect.    ED Course  Procedures (including critical care time)  DIAGNOSTIC STUDIES: Oxygen Saturation is 100% on RA, Normal by my interpretation.    COORDINATION OF CARE: 9:15 PM- Will give antibiotics. Discussed treatment plan with pt at bedside and pt agreed to plan.     Labs Review Labs Reviewed - No data to display Imaging Review No results found.  EKG Interpretation   None       MDM   1. Lymphadenitis    Patient with some mild swelling of her lymph nodes in the neck. She has no pain on swallowing or difficulty breathing. I don't palpate any masses or swelling of the neck. She has no airway compromise or difficulty swallowing. Given history of subjective fevers with a recent cat scratch I will go ahead and treat her with Zithromax. I did give her strict return precautions to return if she feels like she has any increased throat swelling or difficulty swallowing. At this point I did not feel the imaging studies were indicated of her neck.  I personally performed the services described in this documentation, which was scribed in my presence.  The recorded information has been reviewed and considered.   Rolan Bucco, MD 03/21/13 [phone]

## 2013-03-21 NOTE — Discharge Instructions (Signed)
Cervical Adenitis You have a swollen lymph gland in your neck. This commonly happens with Strep and virus infections, dental problems, insect bites, and injuries about the face, scalp, or neck. The lymph glands swell as the body fights the infection or heals the injury. Swelling and firmness typically lasts for several weeks after the infection or injury is healed. Rarely lymph glands can become swollen because of cancer or TB. Antibiotics are prescribed if there is evidence of an infection. Sometimes an infected lymph gland becomes filled with pus. This condition may require opening up the abscessed gland by draining it surgically. Most of the time infected glands return to normal within two weeks. Do not poke or squeeze the swollen lymph nodes. That may keep them from shrinking back to their normal size. If the lymph gland is still swollen after 2 weeks, further medical evaluation is needed.  SEEK IMMEDIATE MEDICAL CARE IF:  You have difficulty swallowing or breathing, increased swelling, severe pain, or a high fever.  Document Released: 03/02/2005 Document Revised: 05/25/2011 Document Reviewed: 08/22/2006 [employer] Patient Information 2014 [employer].  

## 2015-03-21 ENCOUNTER — Encounter (HOSPITAL_BASED_OUTPATIENT_CLINIC_OR_DEPARTMENT_OTHER): Payer: Self-pay

## 2015-03-21 ENCOUNTER — Emergency Department (HOSPITAL_BASED_OUTPATIENT_CLINIC_OR_DEPARTMENT_OTHER)
Admission: EM | Admit: 2015-03-21 | Discharge: 2015-03-21 | Disposition: A | Discharge status: Home / Self Care | Payer: Medicaid Other | Attending: Emergency Medicine | Admitting: Emergency Medicine

## 2015-03-21 DIAGNOSIS — F419 Anxiety disorder, unspecified: Secondary | ICD-10-CM | POA: Diagnosis not present

## 2015-03-21 DIAGNOSIS — Z87891 Personal history of nicotine dependence: Secondary | ICD-10-CM | POA: Diagnosis not present

## 2015-03-21 DIAGNOSIS — Z79899 Other long term (current) drug therapy: Secondary | ICD-10-CM | POA: Insufficient documentation

## 2015-03-21 DIAGNOSIS — R05 Cough: Secondary | ICD-10-CM | POA: Diagnosis present

## 2015-03-21 DIAGNOSIS — R059 Cough, unspecified: Secondary | ICD-10-CM

## 2015-03-21 MED ORDER — BENZONATATE 100 MG PO CAPS
200.0000 mg | ORAL_CAPSULE | Freq: Once | ORAL | Status: AC
  Administered 2015-03-21: 200 mg via ORAL
  Filled 2015-03-21: qty 2

## 2015-03-21 MED ORDER — FLUTICASONE PROPIONATE 50 MCG/ACT NA SUSP
2.0000 | Freq: Every day | NASAL | Status: DC
Start: 2015-03-21 — End: 2019-02-28

## 2015-03-21 MED ORDER — DESLORATADINE-PSEUDOEPHED ER 5-240 MG PO TB24: 1.0000 | ORAL_TABLET | Freq: Every day | ORAL | Status: DC

## 2015-03-21 MED ORDER — BENZONATATE 100 MG PO CAPS: 100.0000 mg | ORAL_CAPSULE | Freq: Three times a day (TID) | ORAL | Status: DC

## 2015-03-21 NOTE — ED Provider Notes (Signed)
CSN: [id number]     Arrival date & time 03/21/15  0156 History   First MD Initiated Contact with Patient 03/21/15 0248     Chief Complaint  Patient presents with  . Cough     (Consider location/radiation/quality/duration/timing/severity/associated sxs/prior Treatment) Patient is a 35 y.o. female presenting with cough. The history is provided by the patient.  Cough Cough characteristics:  Non-productive Severity:  Moderate Onset quality:  Gradual Timing:  Intermittent Progression:  Unchanged Chronicity:  New Smoker: no   Context: not fumes   Relieved by:  Nothing Worsened by:  Nothing tried Ineffective treatments:  None tried Associated symptoms: sinus congestion   Associated symptoms: no chest pain, no diaphoresis, no ear pain, no fever, no shortness of breath, no sore throat and no wheezing   Risk factors: no recent travel     Past Medical History  Diagnosis Date  . Anxiety    History reviewed. No pertinent past surgical history. History reviewed. No pertinent family history. Social History  Substance Use Topics  . Smoking status: Former Games developermoker  . Smokeless tobacco: None  . Alcohol Use: No   OB History    No data available     Review of Systems  Constitutional: Negative for fever and diaphoresis.  HENT: Negative for drooling, ear discharge, ear pain and sore throat.   Respiratory: Positive for cough. Negative for shortness of breath, wheezing and stridor.   Cardiovascular: Negative for chest pain, palpitations and leg swelling.  All other systems reviewed and are negative.     Allergies  Review of patient's allergies indicates no known allergies.  Home Medications   Prior to Admission medications   Medication Sig Start Date End Date Taking? Authorizing Provider  acetaminophen (TYLENOL) 500 MG tablet Take 1,000 mg by mouth every 6 (six) hours as needed. For pain    Historical Provider, MD  azithromycin (ZITHROMAX Z-PAK) 250 MG tablet 2 po day one, then 1  daily x 4 days 03/21/13   Rolan BuccoMelanie Belfi, MD  benzonatate (TESSALON) 100 MG capsule Take 1 capsule (100 mg total) by mouth every 8 (eight) hours. 03/21/15   Fayola Meckes, MD  desloratadine-pseudoephedrine (CLARINEX-D 24 HOUR) 5-240 MG 24 hr tablet Take 1 tablet by mouth daily. 03/21/15   Deloras Reichard, MD  fluticasone [facility](FLONASE) 50 MCG/ACT nasal spray Place 2 sprays into both nostrils daily. 03/21/15   Rajeev Escue, MD  hydrOXYzine (ATARAX/VISTARIL) 25 MG tablet Take 25 mg by mouth 3 (three) times daily as needed.    Historical Provider, MD  ibuprofen (ADVIL,MOTRIN) 200 MG tablet Take 400 mg by mouth every 6 (six) hours as needed. For pain    Historical Provider, MD   BP 96/71 mmHg  Pulse 77  Temp(Src) 98.1 F (36.7 C)  Resp 18  SpO2 100% Physical Exam  Constitutional: She is oriented to person, place, and time. She appears well-developed and well-nourished. No distress.  HENT:  Head: Normocephalic and atraumatic.  Mouth/Throat: Oropharynx is clear and moist.  Eyes: Conjunctivae are normal. Pupils are equal, round, and reactive to light.  Neck: Normal range of motion. Neck supple.  Cardiovascular: Normal rate, regular rhythm and intact distal pulses.   Pulmonary/Chest: Effort normal and breath sounds normal. No stridor. No respiratory distress. She has no wheezes. She has no rales. She exhibits no tenderness.  Abdominal: Soft. Bowel sounds are normal. There is no tenderness. There is no rebound and no guarding.  Musculoskeletal: Normal range of motion.  Lymphadenopathy:    She has no  cervical adenopathy.  Neurological: She is alert and oriented to person, place, and time.  Skin: Skin is warm and dry.  Psychiatric: She has a normal mood and affect.    ED Course  Procedures (including critical care time) Labs Review Labs Reviewed - No data to display  Imaging Review No results found. I have personally reviewed and evaluated these images and lab results as part of my medical  decision-making.   EKG Interpretation None      MDM   Final diagnoses:  Cough  lungs are clear normal vitals.  No indication for imaging at this time   URI: Will treat symptomatically.  Follow up with your PMD in 2 days for recheck.  Strict return precautions given    Tanikka Bresnan, MD 03/21/15 8416

## 2015-03-21 NOTE — ED Notes (Signed)
Pt states dx with bronchitis a wk ago, doesn't feel any better

## 2015-03-21 NOTE — Discharge Instructions (Signed)
Cool Mist Vaporizers  Vaporizers may help relieve the symptoms of a cough and cold. They add moisture to the air, which helps mucus to become thinner and less sticky. This makes it easier to breathe and cough up secretions. Cool mist vaporizers do not cause serious burns like hot mist vaporizers, which may also be called steamers or humidifiers. Vaporizers have not been proven to help with colds. You should not use a vaporizer if you are allergic to mold.  HOME CARE INSTRUCTIONS  · Follow the package instructions for the vaporizer.  · Do not use anything other than distilled water in the vaporizer.  · Do not run the vaporizer all of the time. This can cause mold or bacteria to grow in the vaporizer.  · Clean the vaporizer after each time it is used.  · Clean and dry the vaporizer well before storing it.  · Stop using the vaporizer if worsening respiratory symptoms develop.     This information is not intended to replace advice given to you by your health care provider. Make sure you discuss any questions you have with your health care provider.     Document Released: 11/28/2003 Document Revised: 03/07/2013 Document Reviewed: 07/20/2012  Elsevier Interactive Patient Education ©2016 [employer]

## 2015-03-21 NOTE — ED Notes (Signed)
De;ete last assessment noted wrong patient assessment

## 2016-06-25 ENCOUNTER — Encounter (HOSPITAL_COMMUNITY): Payer: Self-pay | Admitting: Emergency Medicine

## 2016-06-25 ENCOUNTER — Emergency Department (HOSPITAL_COMMUNITY)
Admission: EM | Admit: 2016-06-25 | Discharge: 2016-06-25 | Disposition: A | Discharge status: Home / Self Care | Payer: Medicaid Other | Attending: Emergency Medicine | Admitting: Emergency Medicine

## 2016-06-25 DIAGNOSIS — F17210 Nicotine dependence, cigarettes, uncomplicated: Secondary | ICD-10-CM | POA: Diagnosis not present

## 2016-06-25 DIAGNOSIS — L080 Pyoderma: Secondary | ICD-10-CM | POA: Insufficient documentation

## 2016-06-25 DIAGNOSIS — R21 Rash and other nonspecific skin eruption: Secondary | ICD-10-CM | POA: Diagnosis present

## 2016-06-25 MED ORDER — SULFAMETHOXAZOLE-TRIMETHOPRIM 800-160 MG PO TABS: 1.0000 | ORAL_TABLET | Freq: Two times a day (BID) | ORAL | 0 refills | Status: AC

## 2016-06-25 NOTE — ED Notes (Signed)
Pt stable, understands discharge instructions, and reasons for return.   

## 2016-06-25 NOTE — ED Notes (Signed)
Pt came up to desk and informed this RN that she was leaving. Pt informed that she has a room and agreed to stay.

## 2016-06-25 NOTE — ED Provider Notes (Signed)
[facility]-EMERGENCY DEPT Provider Note   CSN: [id number] Arrival date & time: 06/25/16  1950   By signing my name below, I, Clarisse Gouge, attest that this documentation has been prepared under the direction and in the presence of [facility], FNP. Electronically Signed: Clarisse Gouge, Scribe. 06/25/16. 9:31 PM.   History   Chief Complaint Chief Complaint  Patient presents with  . Rash   The history is provided by the patient and medical records. No language interpreter was used.    Deanna Walton is a 36 y.o. female with h/o occasional tobacco use, self transported via private vehicle to the Emergency Department with concern for a raised, red bump to the L shoulder. She expresses concern for gradual onset purulence to the area s/p "popping" the bump and applying proactive to it. Pt notes associated 2/10 burning pain to affected areas of skin. 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 Pt denies coughing, fevers, N/V.  Past Medical History:  Diagnosis Date  . Anxiety     There are no active problems to display for this patient.   History reviewed. No pertinent surgical history.  OB History    No data available       Home Medications    Prior to Admission medications   Medication Sig Start Date End Date Taking? Authorizing Provider  acetaminophen (TYLENOL) 500 MG tablet Take 1,000 mg by mouth every 6 (six) hours as needed. For pain    Historical Provider, MD  azithromycin (ZITHROMAX Z-PAK) 250 MG tablet 2 po day one, then 1 daily x 4 days 03/21/13   Rolan Bucco, MD  benzonatate (TESSALON) 100 MG capsule Take 1 capsule (100 mg total) by mouth every 8 (eight) hours. 03/21/15   April Palumbo, MD  desloratadine-pseudoephedrine (CLARINEX-D 24 HOUR) 5-240 MG 24 hr tablet Take 1 tablet by mouth daily. 03/21/15   April Palumbo, MD  fluticasone [facility]) 50 MCG/ACT nasal spray Place 2 sprays into both nostrils daily. 03/21/15   April  Palumbo, MD  hydrOXYzine (ATARAX/VISTARIL) 25 MG tablet Take 25 mg by mouth 3 (three) times daily as needed.    Historical Provider, MD  ibuprofen (ADVIL,MOTRIN) 200 MG tablet Take 400 mg by mouth every 6 (six) hours as needed. For pain    Historical Provider, MD  sulfamethoxazole-trimethoprim (BACTRIM DS,SEPTRA DS) 800-160 MG tablet Take 1 tablet by mouth 2 (two) times daily. 06/25/16 07/02/16  Hope Orlene Och, NP    Family History No family history on file.  Social History Social History  Substance Use Topics  . Smoking status: Current Some Day Smoker    Types: Cigarettes  . Smokeless tobacco: Never Used  . Alcohol use No     Allergies   Patient has no known allergies.   Review of Systems Review of Systems  Constitutional: Negative for fever.  Respiratory: Negative for cough.   Gastrointestinal: Negative for nausea and vomiting.  Skin: Positive for color change and wound.       +raised bumps  All other systems reviewed and are negative.    Physical Exam Updated Vital Signs BP 133/67 (BP Location: Left Arm)   Pulse 94   Temp 98.2 F (36.8 C) (Oral)   Resp 16   LMP 05/28/2016 (Exact Date)   SpO2 99%   Physical Exam  Constitutional: She is oriented to person, place, and time. She appears well-developed and well-nourished.  HENT:  Head: Normocephalic and atraumatic.  Eyes: Conjunctivae are  normal.  Neck: Neck supple.  Cardiovascular: Normal rate.   Pulmonary/Chest: Effort normal.  Musculoskeletal: Normal range of motion.  Neurological: She is alert and oriented to person, place, and time. Coordination normal.  Skin: Skin is warm and dry. There is erythema.  Multiple tiny raised pustular areas to the L side of neck/shoulder.  Psychiatric: She has a normal mood and affect. Her behavior is normal. Judgment and thought content normal.  Nursing note and vitals reviewed.    ED Treatments / Results  DIAGNOSTIC STUDIES: Oxygen Saturation is 99% on RA, NL by my  interpretation.    COORDINATION OF CARE: 9:30 PM Discussed treatment plan with pt at bedside and pt agreed to plan. Will Rx medications. Pt prepared for d/c, advised of symptomatic care at home and return precautions.   Labs (all labs ordered are listed, but only abnormal results are displayed) Labs Reviewed - No data to display  EKG Radiology No results found.  Procedures Procedures (including critical care time)  Medications Ordered in ED Medications - No data to display   Initial Impression / Assessment and Plan / ED Course  I have reviewed the triage vital signs and the nursing notes.   Final Clinical Impressions(s) / ED Diagnoses  36 y.o. female with pustular areas to the left side of her neck and shoulder stable for d/c without red streaking or signs of cellulitis. Will treat with Bactrim and she will f/u with her PCP or return here for worsening symptoms.  Final diagnoses:  Pustular rash    New Prescriptions New Prescriptions   SULFAMETHOXAZOLE-TRIMETHOPRIM (BACTRIM DS,SEPTRA DS) 800-160 MG TABLET    Take 1 tablet by mouth 2 (two) times daily.  I personally performed the services described in this documentation, which was scribed in my presence. The recorded information has been reviewed and is accurate.    [place], [place] 06/25/16 2144    Arby Barrette, MD 06/29/16 1048

## 2016-06-25 NOTE — Discharge Instructions (Signed)
You have small pustular area to the left side of your neck. Apply warm wet compresses to the areas and take the antibiotics as directed. Return as needed for worsening symptoms.

## 2016-06-25 NOTE — ED Notes (Signed)
Pt c/o pimple on left shoulder. See providers assessment.

## 2016-11-14 ENCOUNTER — Emergency Department (HOSPITAL_COMMUNITY)
Admission: EM | Admit: 2016-11-14 | Discharge: 2016-11-14 | Disposition: A | Discharge status: Home / Self Care | Payer: Medicaid Other | Attending: Emergency Medicine | Admitting: Emergency Medicine

## 2016-11-14 ENCOUNTER — Encounter (HOSPITAL_COMMUNITY): Payer: Self-pay | Admitting: Emergency Medicine

## 2016-11-14 DIAGNOSIS — Z5321 Procedure and treatment not carried out due to patient leaving prior to being seen by health care provider: Secondary | ICD-10-CM | POA: Diagnosis present

## 2016-11-14 DIAGNOSIS — W57XXXA Bitten or stung by nonvenomous insect and other nonvenomous arthropods, initial encounter: Secondary | ICD-10-CM | POA: Diagnosis not present

## 2016-11-14 NOTE — ED Triage Notes (Signed)
Pt states at 0900 she felt something sting or bite her left upper thigh pt is unsure what bite her but now is burning and red.

## 2017-03-07 ENCOUNTER — Other Ambulatory Visit: Payer: Self-pay

## 2017-03-07 ENCOUNTER — Encounter: Payer: Self-pay | Admitting: Emergency Medicine

## 2017-03-07 ENCOUNTER — Emergency Department
Admission: EM | Admit: 2017-03-07 | Discharge: 2017-03-07 | Disposition: A | Discharge status: Home / Self Care | Payer: Medicaid Other | Attending: Emergency Medicine | Admitting: Emergency Medicine

## 2017-03-07 DIAGNOSIS — F17210 Nicotine dependence, cigarettes, uncomplicated: Secondary | ICD-10-CM | POA: Diagnosis not present

## 2017-03-07 DIAGNOSIS — Z79899 Other long term (current) drug therapy: Secondary | ICD-10-CM | POA: Diagnosis not present

## 2017-03-07 DIAGNOSIS — R059 Cough, unspecified: Secondary | ICD-10-CM

## 2017-03-07 DIAGNOSIS — R05 Cough: Secondary | ICD-10-CM | POA: Diagnosis present

## 2017-03-07 DIAGNOSIS — J40 Bronchitis, not specified as acute or chronic: Secondary | ICD-10-CM | POA: Diagnosis not present

## 2017-03-07 MED ORDER — AZITHROMYCIN 250 MG PO TABS: ORAL_TABLET | ORAL | 0 refills | Status: AC

## 2017-03-07 MED ORDER — GUAIFENESIN ER 600 MG PO TB12: 600.0000 mg | ORAL_TABLET | Freq: Two times a day (BID) | ORAL | 0 refills | Status: AC

## 2017-03-07 NOTE — ED Notes (Signed)
Pt verbalizes understanding of discharge instructions.

## 2017-03-07 NOTE — ED Triage Notes (Signed)
Pt reports she has been having a cough since Tuesday, reports she has been taking OTC medications not helping with cough. Pt reports productive cough green in color Pt talks in complete sentences no distress noted.

## 2017-03-07 NOTE — ED Provider Notes (Signed)
[facility] Emergency Department Provider Note  Time seen: 4:24 AM  I have reviewed the triage vital signs and the nursing notes.   HISTORY  Chief Complaint Cough    HPI Deanna Walton is a 36 y.o. female with a past medical history of anxiety presents to the emergency department for cough, congestion.  According to the patient for the past 6 days she has had a constant cough as well as sinus and chest congestion.  States occasionally she will get sputum out with a cough which is now more of a yellow-green color.  Patient denies any fever.  Does state sore throat but that has improved.  Patient is also noticed a lump to her left throat which concerned her as well.  Denies any trouble breathing but does state cough with deep inspiration.   Past Medical History:  Diagnosis Date  . Anxiety     There are no active problems to display for this patient.   History reviewed. No pertinent surgical history.  Prior to Admission medications   Medication Sig Start Date End Date Taking? Authorizing Provider  acetaminophen (TYLENOL) 500 MG tablet Take 1,000 mg by mouth every 6 (six) hours as needed. For pain    [provider]  azithromycin (ZITHROMAX Z-PAK) 250 MG tablet 2 po day one, then 1 daily x 4 days 03/21/13   Rolan BuccoBelfi, Melanie, MD  benzonatate (TESSALON) 100 MG capsule Take 1 capsule (100 mg total) by mouth every 8 (eight) hours. 03/21/15   Palumbo, April, MD  desloratadine-pseudoephedrine (CLARINEX-D 24 HOUR) 5-240 MG 24 hr tablet Take 1 tablet by mouth daily. 03/21/15   Palumbo, April, MD  fluticasone (FLONASE) 50 MCG/ACT nasal spray Place 2 sprays into both nostrils daily. 03/21/15   Palumbo, April, MD  hydrOXYzine (ATARAX/VISTARIL) 25 MG tablet Take 25 mg by mouth 3 (three) times daily as needed.    [provider]  ibuprofen (ADVIL,MOTRIN) 200 MG tablet Take 400 mg by mouth every 6 (six) hours as needed. For pain    [provider]    No  Known Allergies  No family history on file.  Social History Social History   Tobacco Use  . Smoking status: Current Some Day Smoker    Types: Cigarettes  . Smokeless tobacco: Never Used  Substance Use Topics  . Alcohol use: No  . Drug use: No    Review of Systems Constitutional: Negative for fever. ENT: Positive for congestion Cardiovascular: Negative for chest pain. Respiratory: Positive for cough with green/yellow sputum production Gastrointestinal: Negative for abdominal pain, vomiting and diarrhea Neurological: Negative for headache All other ROS negative  ____________________________________________   PHYSICAL EXAM:  VITAL SIGNS: ED Triage Vitals  Enc Vitals Group     BP 03/07/17 0228 117/65     Pulse Rate 03/07/17 0228 99     Resp 03/07/17 0228 20     Temp 03/07/17 0228 98.7 F (37.1 C)     Temp Source 03/07/17 0228 Oral     SpO2 03/07/17 0228 98 %     Weight 03/07/17 0230 153 lb (69.4 kg)     Height 03/07/17 0230 5\' 3"  (1.6 m)     Head Circumference --      Peak Flow --      Pain Score 03/07/17 0227 7     Pain Loc --      Pain Edu? --      Excl. in GC? --    Constitutional: Alert and oriented.  Well appearing and in no distress. Eyes: Normal exam ENT   Head: Normocephalic and atraumatic.   Mouth/Throat: Mucous membranes are moist.  Mild pharyngeal erythema without exudate or significant tonsillar hypertrophy.  Small lump to left throat most consistent with lymph node.  Mild tenderness. Cardiovascular: Normal rate, regular rhythm. No murmur Respiratory: Normal respiratory effort without tachypnea nor retractions. Breath sounds are clear.  Frequent cough during exam. Gastrointestinal: Soft and nontender. No distention.   Musculoskeletal: Nontender with normal range of motion in all extremities.  Neurologic:  Normal speech and language. No gross focal neurologic deficits  Skin:  Skin is warm, dry and intact.  Psychiatric: Mood and affect are  normal.   ____________________________________________   INITIAL IMPRESSION / ASSESSMENT AND PLAN / ED COURSE  Pertinent labs & imaging results that were available during my care of the patient were reviewed by me and considered in my medical decision making (see chart for details).  Patient presents to the emergency department with cough and congestion for the past 6 days.  On exam patient is clear lung sounds, no wheeze rales or rhonchi.  Patient does have frequent cough in the emergency department.  Differential would include pneumonia, URI, viral illness, bronchitis.  Patient was a former smoker quit 2 weeks ago.  Clear lung sounds a very low suspicion for pneumonia.  Given the patient's frequent cough during examination I suspect likely underlying bronchitis.  As the patient has been ongoing with this illness for 6 days we will cover with antibiotics and prescribed guaifenesin as an expectorant.  Patient agreeable to plan of care and will follow up with her doctor.  Patient does have a small lump/bump to the left throat which correlates with a likely anterior cervical lymph node.  I discussed with the patient if the node does not resolve within the next 3-4 weeks to follow-up with her doctor for further evaluation and the patient is agreeable.  ____________________________________________   FINAL CLINICAL IMPRESSION(S) / ED DIAGNOSES  Bronchitis    Deanna Walton, Melodye Swor, MD 03/07/17 [phone]

## 2017-03-22 ENCOUNTER — Other Ambulatory Visit: Payer: Self-pay

## 2017-03-22 ENCOUNTER — Emergency Department: Payer: Medicaid Other

## 2017-03-22 ENCOUNTER — Emergency Department
Admission: EM | Admit: 2017-03-22 | Discharge: 2017-03-22 | Disposition: A | Discharge status: Home / Self Care | Payer: Medicaid Other | Attending: Emergency Medicine | Admitting: Emergency Medicine

## 2017-03-22 DIAGNOSIS — R079 Chest pain, unspecified: Secondary | ICD-10-CM | POA: Insufficient documentation

## 2017-03-22 DIAGNOSIS — R42 Dizziness and giddiness: Secondary | ICD-10-CM | POA: Diagnosis not present

## 2017-03-22 DIAGNOSIS — Z79899 Other long term (current) drug therapy: Secondary | ICD-10-CM | POA: Diagnosis not present

## 2017-03-22 DIAGNOSIS — R05 Cough: Secondary | ICD-10-CM | POA: Insufficient documentation

## 2017-03-22 DIAGNOSIS — R5381 Other malaise: Secondary | ICD-10-CM | POA: Diagnosis present

## 2017-03-22 DIAGNOSIS — G9331 Postviral fatigue syndrome: Secondary | ICD-10-CM

## 2017-03-22 DIAGNOSIS — G933 Postviral fatigue syndrome: Secondary | ICD-10-CM | POA: Insufficient documentation

## 2017-03-22 LAB — COMPREHENSIVE METABOLIC PANEL
ALT: 25 U/L (ref 14–54)
ANION GAP: 8 (ref 5–15)
AST: 23 U/L (ref 15–41)
Albumin: 4.5 g/dL (ref 3.5–5.0)
Alkaline Phosphatase: 68 U/L (ref 38–126)
BUN: 11 mg/dL (ref 6–20)
CO2: 26 mmol/L (ref 22–32)
Calcium: 9.4 mg/dL (ref 8.9–10.3)
Chloride: 103 mmol/L (ref 101–111)
Creatinine, Ser: 0.52 mg/dL (ref 0.44–1.00)
GFR calc Af Amer: >60 mL/min (ref >60)
GFR calc non Af Amer: >60 mL/min (ref >60)
GLUCOSE: 106 mg/dL — AB (ref 65–99)
POTASSIUM: 3.8 mmol/L (ref 3.5–5.1)
SODIUM: 137 mmol/L (ref 135–145)
TOTAL PROTEIN: 8.1 g/dL (ref 6.5–8.1)
Total Bilirubin: 0.8 mg/dL (ref 0.3–1.2)

## 2017-03-22 LAB — URINALYSIS, COMPLETE (UACMP) WITH MICROSCOPIC
BACTERIA UA: NONE SEEN
BILIRUBIN URINE: NEGATIVE
Glucose, UA: NEGATIVE mg/dL
KETONES UR: NEGATIVE mg/dL
LEUKOCYTES UA: NEGATIVE
Nitrite: NEGATIVE
PH: 5 (ref 5.0–8.0)
Protein, ur: NEGATIVE mg/dL
Specific Gravity, Urine: 1.018 (ref 1.005–1.030)

## 2017-03-22 LAB — CBC
HCT: 38.8 % (ref 35.0–47.0)
Hemoglobin: 13.3 g/dL (ref 12.0–16.0)
MCH: 31.6 pg (ref 26.0–34.0)
MCHC: 34.3 g/dL (ref 32.0–36.0)
MCV: 92.1 fL (ref 80.0–100.0)
PLATELETS: 348 10*3/uL (ref 150–440)
RBC: 4.21 MIL/uL (ref 3.80–5.20)
RDW: 13.2 % (ref 11.5–14.5)
WBC: 8.2 10*3/uL (ref 3.6–11.0)

## 2017-03-22 LAB — TROPONIN I

## 2017-03-22 LAB — POCT PREGNANCY, URINE: Preg Test, Ur: NEGATIVE

## 2017-03-22 NOTE — ED Provider Notes (Signed)
[facility] Emergency Department Provider Note  ____________________________________________   First MD Initiated Contact with Patient 03/22/17 1050     (approximate)  I have reviewed the triage vital signs and the nursing notes.   HISTORY  Chief Complaint Chest Pain and Dizziness    HPI Deanna Walton is a 37 y.o. female who self presents to the emergency department with generalized malaise for the past week or 2.  She was recently diagnosed with bronchitis for which she completed a course of azithromycin without improvement in her symptoms.  She said that several weeks ago she stopped smoking cigarettes however 2 days ago she smoked a friend's E cigarette and her symptoms really became more severe thereafter.  She does report mild intermittent upper nonradiating chest discomfort.  It seems to be worse with deep breaths and somewhat improved with rest.  She denies fevers or chills.  She does report mild cough.  Past Medical History:  Diagnosis Date  . Anxiety     There are no active problems to display for this patient.   No past surgical history on file.  Prior to Admission medications   Medication Sig Start Date End Date Taking? Authorizing Provider  acetaminophen (TYLENOL) 500 MG tablet Take 1,000 mg by mouth every 6 (six) hours as needed. For pain    [provider]  benzonatate (TESSALON) 100 MG capsule Take 1 capsule (100 mg total) by mouth every 8 (eight) hours. 03/21/15   Palumbo, April, MD  desloratadine-pseudoephedrine (CLARINEX-D 24 HOUR) 5-240 MG 24 hr tablet Take 1 tablet by mouth daily. 03/21/15   Palumbo, April, MD  fluticasone (FLONASE) 50 MCG/ACT nasal spray Place 2 sprays into both nostrils daily. 03/21/15   Palumbo, April, MD  hydrOXYzine (ATARAX/VISTARIL) 25 MG tablet Take 25 mg by mouth 3 (three) times daily as needed.    [provider]  ibuprofen (ADVIL,MOTRIN) 200 MG tablet Take 400 mg by mouth every 6 (six) hours as  needed. For pain    [provider]    Allergies Patient has no known allergies.  No family history on file.  Social History Social History   Tobacco Use  . Smoking status: Current Some Day Smoker    Types: Cigarettes  . Smokeless tobacco: Never Used  Substance Use Topics  . Alcohol use: No  . Drug use: No    Review of Systems Constitutional: No fever/chills ENT: No sore throat. Cardiovascular: Positive for chest pain. Respiratory: Positive for cough Gastrointestinal: No abdominal pain.  No nausea, no vomiting.  No diarrhea.  No constipation. Musculoskeletal: Negative for back pain. Neurological: Negative for headaches   ____________________________________________   PHYSICAL EXAM:  VITAL SIGNS: ED Triage Vitals  Enc Vitals Group     BP 03/22/17 0950 112/70     Pulse Rate 03/22/17 0950 77     Resp 03/22/17 0950 20     Temp 03/22/17 0950 97.8 F (36.6 C)     Temp Source 03/22/17 0950 Oral     SpO2 03/22/17 0950 99 %     Weight 03/22/17 0951 155 lb (70.3 kg)     Height 03/22/17 0951 5\' 3"  (1.6 m)     Head Circumference --      Peak Flow --      Pain Score 03/22/17 0950 8     Pain Loc --      Pain Edu? --      Excl. in GC? --     Constitutional: Alert  and oriented x4 well-appearing nontoxic no diaphoresis speaks in complete sentences Head: Atraumatic. Nose: No congestion/rhinnorhea. Mouth/Throat: No trismus uvula midline no pharyngeal erythema or exudate Neck: No stridor.   Cardiovascular: Regular rate and rhythm Respiratory: Normal respiratory effort.  No retractions.  Clear to auscultation bilaterally Gastrointestinal: Soft nontender Neurologic:  Normal speech and language. No gross focal neurologic deficits are appreciated.  Skin:  Skin is warm, dry and intact. No rash noted.    ____________________________________________  LABS (all labs ordered are listed, but only abnormal results are displayed)  Labs Reviewed  COMPREHENSIVE  METABOLIC PANEL - Abnormal; Notable for the following components:      Result Value   Glucose, Bld 106 (*)    All other components within normal limits  URINALYSIS, COMPLETE (UACMP) WITH MICROSCOPIC - Abnormal; Notable for the following components:   Color, Urine YELLOW (*)    APPearance CLEAR (*)    Hgb urine dipstick SMALL (*)    Squamous Epithelial / LPF 6-30 (*)    All other components within normal limits  CBC  TROPONIN I  POC URINE PREG, ED  POCT PREGNANCY, URINE    Lab work reviewed by me with no acute disease __________________________________________  EKG  ED ECG REPORT I, Merrily Brittle, the attending physician, personally viewed and interpreted this ECG.  Date: 03/22/2017 EKG Time:  Rate: 75 Rhythm: normal sinus rhythm QRS Axis: normal Intervals: normal ST/T Wave abnormalities: normal Narrative Interpretation: no evidence of acute ischemia  ____________________________________________  RADIOLOGY  Chest x-ray reviewed by me with no acute disease ____________________________________________   DIFFERENTIAL includes but not limited to  Viral syndrome, post viral cough, bronchitis, pneumonia, pneumothorax   PROCEDURES  Procedure(s) performed: no  Procedures  Critical Care performed: no  Observation: no ____________________________________________   INITIAL IMPRESSION / ASSESSMENT AND PLAN / ED COURSE  Pertinent labs & imaging results that were available during my care of the patient were reviewed by me and considered in my medical decision making (see chart for details).  Patient arrives hemodynamically stable and very well-appearing with post viral symptoms.  Chest x-ray with no evidence of pneumonia.  We had a lengthy discussion with patient regarding post viral syndrome and her primary concern is cough so prescribe her Jerilynn Som for outpatient.  Strict return precautions have been given and the patient verbalized understanding and  agreement with the plan.      ____________________________________________   FINAL CLINICAL IMPRESSION(S) / ED DIAGNOSES  Final diagnoses:  Post viral syndrome      NEW MEDICATIONS STARTED DURING THIS VISIT:  This SmartLink is deprecated. Use AVSMEDLIST instead to display the medication list for a patient.   Note:  This document was prepared using Dragon voice recognition software and may include unintentional dictation errors.      Merrily Brittle, MD 03/23/17 1122

## 2017-03-22 NOTE — ED Notes (Signed)
Pt alert and oriented X4, active, cooperative, pt in NAD. RR even and unlabored, color WNL.  Pt informed to return if any life threatening symptoms occur.  Discharge and followup instructions reviewed.  

## 2017-03-22 NOTE — ED Notes (Signed)
Pt reports recent dx of bronchitis in which she was prescribed azithromycin X 5 days; finished prescription. Saturday night patient smoked a friends e cigarette and has "felt funny" since. Sunday morning patient awoke, drank coffee, experienced chest pain, chest palpitations and fingers tingling. Intermittent chest tightness since first occurrence Sunday. Denies pain at this time. Pt alert and oriented X4, active, cooperative, pt in NAD. RR even and unlabored, color WNL.

## 2017-03-22 NOTE — ED Triage Notes (Signed)
States chest tightness and dizziness began yesterday. Recent history of bronchitis.

## 2017-03-22 NOTE — Discharge Instructions (Signed)
CONGRATULATIONS ON QUITTING SMOKING!  THAT'S A HUGE DEAL AND YOU SHOULD BE PROUD!  Fortunately today your blood work, your chest x-ray, and your EKG were reassuring.  Please continue to remain well-hydrated and follow-up with your primary care physician as needed.  It was a pleasure to take care of you today, and thank you for coming to our emergency department.  If you have any questions or concerns before leaving please ask the nurse to grab me and I'm more than happy to go through your aftercare instructions again.  If you were prescribed any opioid pain medication today such as Norco, Vicodin, Percocet, morphine, hydrocodone, or oxycodone please make sure you do not drive when you are taking this medication as it can alter your ability to drive safely.  If you have any concerns once you are home that you are not improving or are in fact getting worse before you can make it to your follow-up appointment, please do not hesitate to call 911 and come back for further evaluation.  Merrily BrittleNeil Zhanna Melin, MD  Results for orders placed or performed during the hospital encounter of 03/22/17  CBC  Result Value Ref Range   WBC 8.2 3.6 - 11.0 K/uL   RBC 4.21 3.80 - 5.20 MIL/uL   Hemoglobin 13.3 12.0 - 16.0 g/dL   HCT 78.238.8 95.635.0 - 21.347.0 %   MCV 92.1 80.0 - 100.0 fL   MCH 31.6 26.0 - 34.0 pg   MCHC 34.3 32.0 - 36.0 g/dL   RDW 08.613.2 57.811.5 - 46.914.5 %   Platelets 348 150 - 440 K/uL  Troponin I  Result Value Ref Range   Troponin I <0.03 <0.03 ng/mL  Comprehensive metabolic panel  Result Value Ref Range   Sodium 137 135 - 145 mmol/L   Potassium 3.8 3.5 - 5.1 mmol/L   Chloride 103 101 - 111 mmol/L   CO2 26 22 - 32 mmol/L   Glucose, Bld 106 (H) 65 - 99 mg/dL   BUN 11 6 - 20 mg/dL   Creatinine, Ser 6.290.52 0.44 - 1.00 mg/dL   Calcium 9.4 8.9 - 52.810.3 mg/dL   Total Protein 8.1 6.5 - 8.1 g/dL   Albumin 4.5 3.5 - 5.0 g/dL   AST 23 15 - 41 U/L   ALT 25 14 - 54 U/L   Alkaline Phosphatase 68 38 - 126 U/L   Total  Bilirubin 0.8 0.3 - 1.2 mg/dL   GFR calc non Af Amer >60 >60 mL/min   GFR calc Af Amer >60 >60 mL/min   Anion gap 8 5 - 15  Urinalysis, Complete w Microscopic  Result Value Ref Range   Color, Urine YELLOW (A) YELLOW   APPearance CLEAR (A) CLEAR   Specific Gravity, Urine 1.018 1.005 - 1.030   pH 5.0 5.0 - 8.0   Glucose, UA NEGATIVE NEGATIVE mg/dL   Hgb urine dipstick SMALL (A) NEGATIVE   Bilirubin Urine NEGATIVE NEGATIVE   Ketones, ur NEGATIVE NEGATIVE mg/dL   Protein, ur NEGATIVE NEGATIVE mg/dL   Nitrite NEGATIVE NEGATIVE   Leukocytes, UA NEGATIVE NEGATIVE   RBC / HPF 0-5 0 - 5 RBC/hpf   WBC, UA 0-5 0 - 5 WBC/hpf   Bacteria, UA NONE SEEN NONE SEEN   Squamous Epithelial / LPF 6-30 (A) NONE SEEN   Mucus PRESENT   Pregnancy, urine POC  Result Value Ref Range   Preg Test, Ur NEGATIVE NEGATIVE   Dg Chest 2 View  Result Date: 03/22/2017 CLINICAL DATA:  Bronchitis, left  chest tightness EXAM: CHEST  2 VIEW COMPARISON:  12/18/2010 FINDINGS: Heart and mediastinal contours are within normal limits. No focal opacities or effusions. No acute bony abnormality. IMPRESSION: No active cardiopulmonary disease. Electronically Signed   By: Charlett Nose M.D.   On: 03/22/2017 10:30

## 2017-05-24 ENCOUNTER — Encounter: Payer: Self-pay | Admitting: Emergency Medicine

## 2017-05-24 ENCOUNTER — Emergency Department
Admission: EM | Admit: 2017-05-24 | Discharge: 2017-05-24 | Disposition: A | Discharge status: Home / Self Care | Payer: Medicaid Other | Attending: Emergency Medicine | Admitting: Emergency Medicine

## 2017-05-24 ENCOUNTER — Other Ambulatory Visit: Payer: Self-pay

## 2017-05-24 DIAGNOSIS — Z79899 Other long term (current) drug therapy: Secondary | ICD-10-CM | POA: Insufficient documentation

## 2017-05-24 DIAGNOSIS — F17210 Nicotine dependence, cigarettes, uncomplicated: Secondary | ICD-10-CM | POA: Insufficient documentation

## 2017-05-24 DIAGNOSIS — R6889 Other general symptoms and signs: Secondary | ICD-10-CM | POA: Diagnosis present

## 2017-05-24 LAB — INFLUENZA PANEL BY PCR (TYPE A & B)
Influenza A By PCR: NEGATIVE
Influenza B By PCR: NEGATIVE

## 2017-05-24 NOTE — ED Provider Notes (Signed)
[facility] Emergency Department Provider Note  ____________________________________________   First MD Initiated Contact with Patient 05/24/17 1002     (approximate)  I have reviewed the triage vital signs and the nursing notes.   HISTORY  Chief Complaint Chills    HPI Deanna Walton is a 37 y.o. female presents emergency department complaining of chills and generalized body aches that started last night.  She has had a cough she said that this only has phlegm nonproductive.  She denies any chest pain or shortness of breath.  She denies any vomiting or diarrhea.  She states her throat was sore yesterday but not today.  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 Anxiety     There are no active problems to display for this patient.   History reviewed. No pertinent surgical history.  Prior to Admission medications   Medication Sig Start Date End Date Taking? Authorizing Provider  acetaminophen (TYLENOL) 500 MG tablet Take 1,000 mg by mouth Walton 6 (six) hours as needed. For pain    [provider]  benzonatate (TESSALON) 100 MG capsule Take 1 capsule (100 mg total) by mouth Walton 8 (eight) hours. 03/21/15   Palumbo, April, MD  desloratadine-pseudoephedrine (CLARINEX-D 24 HOUR) 5-240 MG 24 hr tablet Take 1 tablet by mouth daily. 03/21/15   Palumbo, April, MD  fluticasone (FLONASE) 50 MCG/ACT nasal spray Place 2 sprays into both nostrils daily. 03/21/15   Palumbo, April, MD  hydrOXYzine (ATARAX/VISTARIL) 25 MG tablet Take 25 mg by mouth 3 (three) times daily as needed.    [provider]  ibuprofen (ADVIL,MOTRIN) 200 MG tablet Take 400 mg by mouth Walton 6 (six) hours as needed. For pain    [provider]    Allergies Patient has no known allergies.  No family history on file.  Social History Social History   Tobacco Use  . Smoking status: Current Some Day Smoker    Types:  Cigarettes  . Smokeless tobacco: Never Used  Substance Use Topics  . Alcohol use: No  . Drug use: No    Review of Systems  Constitutional: Positive fever/chills Eyes: No visual changes. ENT: Positive sore throat. Respiratory: Positive cough Genitourinary: Negative for dysuria. Musculoskeletal: Negative for back pain. Skin: Negative for rash.    ____________________________________________   PHYSICAL EXAM:  VITAL SIGNS: ED Triage Vitals [05/24/17 0929]  Enc Vitals Group     BP 112/64     Pulse Rate 82     Resp 16     Temp 98.6 F (37 C)     Temp Source Oral     SpO2 100 %     Weight 153 lb (69.4 kg)     Height 5\' 3"  (1.6 m)     Head Circumference      Peak Flow      Pain Score 6     Pain Loc      Pain Edu?      Excl. in GC?     Constitutional: Alert and oriented. Well appearing and in no acute distress. Eyes: Conjunctivae are normal.  Head: Atraumatic. Ears: TMs are clear bilaterally Nose: No congestion/rhinnorhea. Mouth/Throat: Mucous membranes are moist.  Throat is red Cardiovascular: Normal rate, regular rhythm.  Heart sounds are normal Respiratory: Normal respiratory effort.  No retractions, lungs clear to auscultation GU: deferred Musculoskeletal: FROM all extremities, warm and well perfused Neurologic:  Normal speech and  language.  Skin:  Skin is warm, dry and intact. No rash noted. Psychiatric: Mood and affect are normal. Speech and behavior are normal.  ____________________________________________   LABS (all labs ordered are listed, but only abnormal results are displayed)  Labs Reviewed  INFLUENZA PANEL BY PCR (TYPE A & B)   ____________________________________________   ____________________________________________  RADIOLOGY    ____________________________________________   PROCEDURES  Procedure(s) performed: No  Procedures    ____________________________________________   INITIAL IMPRESSION / ASSESSMENT AND PLAN / ED  COURSE  Pertinent labs & imaging results that were available during my care of the patient were reviewed by me and considered in my medical decision making (see chart for details).  Patient is a 37 year old female presents emergency department complaining of flulike symptoms.  On physical exam the patient appears well, nontoxic.  The exam is benign  Flu swab was obtained in triage   Flu test is negative  Results were given to the patient.  Diagnosis is viral illness.  She is to follow-up with her regular doctor or return emergency department if she is worsening.  She can work note for today.  She is to drink plenty of fluids to offset the body aches.  She is is to take Tylenol and ibuprofen as needed.  She is discharged in stable condition  As part of my medical decision making, I reviewed the following data within the electronic MEDICAL RECORD NUMBER History obtained from family, Labs reviewed flu swab was negative, Notes from prior ED visits and [place] Controlled Substance Database  ____________________________________________   FINAL CLINICAL IMPRESSION(S) / ED DIAGNOSES  Final diagnoses:  Flu-like symptoms      NEW MEDICATIONS STARTED DURING THIS VISIT:  New Prescriptions   No medications on file     Note:  This document was prepared using Dragon voice recognition software and may include unintentional dictation errors.    Faythe Ghee, PA-C 05/24/17 1032    Deanna Every, MD 05/24/17 [phone]

## 2017-05-24 NOTE — Discharge Instructions (Signed)
Follow-up with your regular doctor if not better in 3-4 days.  Take Tylenol and ibuprofen as needed.  Drink plenty of fluids to decrease the body aches.  If you are worsening please return the emergency department or see your regular doctor

## 2017-05-24 NOTE — ED Triage Notes (Signed)
Pt to ED via POV with c/o chills and generalized body aches since last night. VSS, pt ambulatory.

## 2018-07-27 DIAGNOSIS — N946 Dysmenorrhea, unspecified: Secondary | ICD-10-CM | POA: Diagnosis not present

## 2018-07-27 DIAGNOSIS — R10814 Left lower quadrant abdominal tenderness: Secondary | ICD-10-CM | POA: Diagnosis not present

## 2018-07-27 DIAGNOSIS — Z0000 Encounter for general adult medical examination without abnormal findings: Secondary | ICD-10-CM | POA: Diagnosis not present

## 2018-07-27 DIAGNOSIS — Z2001 Contact with and (suspected) exposure to intestinal infectious diseases due to Escherichia coli (E. coli): Secondary | ICD-10-CM | POA: Diagnosis not present

## 2018-11-25 DIAGNOSIS — Z2001 Contact with and (suspected) exposure to intestinal infectious diseases due to Escherichia coli (E. coli): Secondary | ICD-10-CM | POA: Diagnosis not present

## 2018-11-25 DIAGNOSIS — F419 Anxiety disorder, unspecified: Secondary | ICD-10-CM | POA: Diagnosis not present

## 2018-12-01 DIAGNOSIS — A539 Syphilis, unspecified: Secondary | ICD-10-CM | POA: Diagnosis not present

## 2018-12-01 DIAGNOSIS — Z049 Encounter for examination and observation for unspecified reason: Secondary | ICD-10-CM | POA: Diagnosis not present

## 2018-12-01 DIAGNOSIS — F419 Anxiety disorder, unspecified: Secondary | ICD-10-CM | POA: Diagnosis not present

## 2019-02-25 ENCOUNTER — Emergency Department: Payer: Self-pay

## 2019-02-25 ENCOUNTER — Other Ambulatory Visit: Payer: Self-pay

## 2019-02-25 ENCOUNTER — Encounter: Payer: Self-pay | Admitting: Emergency Medicine

## 2019-02-25 ENCOUNTER — Emergency Department
Admission: EM | Admit: 2019-02-25 | Discharge: 2019-02-25 | Disposition: A | Discharge status: Home / Self Care | Payer: Self-pay | Attending: Emergency Medicine | Admitting: Emergency Medicine

## 2019-02-25 DIAGNOSIS — Z79899 Other long term (current) drug therapy: Secondary | ICD-10-CM | POA: Insufficient documentation

## 2019-02-25 DIAGNOSIS — Y929 Unspecified place or not applicable: Secondary | ICD-10-CM | POA: Insufficient documentation

## 2019-02-25 DIAGNOSIS — Y939 Activity, unspecified: Secondary | ICD-10-CM | POA: Insufficient documentation

## 2019-02-25 DIAGNOSIS — Y999 Unspecified external cause status: Secondary | ICD-10-CM | POA: Insufficient documentation

## 2019-02-25 DIAGNOSIS — F17210 Nicotine dependence, cigarettes, uncomplicated: Secondary | ICD-10-CM | POA: Insufficient documentation

## 2019-02-25 DIAGNOSIS — S0990XA Unspecified injury of head, initial encounter: Secondary | ICD-10-CM | POA: Insufficient documentation

## 2019-02-25 DIAGNOSIS — S52601A Unspecified fracture of lower end of right ulna, initial encounter for closed fracture: Secondary | ICD-10-CM | POA: Insufficient documentation

## 2019-02-25 DIAGNOSIS — W010XXA Fall on same level from slipping, tripping and stumbling without subsequent striking against object, initial encounter: Secondary | ICD-10-CM | POA: Insufficient documentation

## 2019-02-25 DIAGNOSIS — S52501A Unspecified fracture of the lower end of right radius, initial encounter for closed fracture: Secondary | ICD-10-CM

## 2019-02-25 DIAGNOSIS — W19XXXA Unspecified fall, initial encounter: Secondary | ICD-10-CM

## 2019-02-25 MED ORDER — OXYCODONE-ACETAMINOPHEN 5-325 MG PO TABS
1.0000 | ORAL_TABLET | ORAL | Status: DC | PRN
  Administered 2019-02-25: 1 via ORAL
  Filled 2019-02-25 (×2): qty 1

## 2019-02-25 MED ORDER — OXYCODONE-ACETAMINOPHEN 5-325 MG PO TABS: 1.0000 | ORAL_TABLET | ORAL | 0 refills | Status: DC | PRN

## 2019-02-25 MED ORDER — OXYCODONE-ACETAMINOPHEN 5-325 MG PO TABS
1.0000 | ORAL_TABLET | Freq: Once | ORAL | Status: AC
  Administered 2019-02-25: 05:00:00 1 via ORAL

## 2019-02-25 NOTE — ED Notes (Signed)
Pt to xray

## 2019-02-25 NOTE — ED Provider Notes (Signed)
[facility] Emergency Department Provider Note ____________________   First MD Initiated Contact with Patient 02/25/19 [phone]     (approximate)  I have reviewed the triage vital signs and the nursing notes.   HISTORY  Chief Complaint Fall    HPI Deanna Walton is a 38 y.o. female density emergency department secondary to accidental fall with resultant right wrist injury/pain that is currently 10 out of 10 worse with any movement of the right wrist.  Patient also admits to striking her head against a tile floor.  Patient denies any loss of consciousness however does admit to "seeing bright lights".        Past Medical History:  Diagnosis Date  . Anxiety     There are no problems to display for this patient.   History reviewed. No pertinent surgical history.  Prior to Admission medications   Medication Sig Start Date End Date Taking? Authorizing Provider  acetaminophen (TYLENOL) 500 MG tablet Take 1,000 mg by mouth every 6 (six) hours as needed. For pain    [provider]  benzonatate (TESSALON) 100 MG capsule Take 1 capsule (100 mg total) by mouth every 8 (eight) hours. 03/21/15   Palumbo, April, MD  desloratadine-pseudoephedrine (CLARINEX-D 24 HOUR) 5-240 MG 24 hr tablet Take 1 tablet by mouth daily. 03/21/15   Palumbo, April, MD  fluticasone (FLONASE) 50 MCG/ACT nasal spray Place 2 sprays into both nostrils daily. 03/21/15   Palumbo, April, MD  hydrOXYzine (ATARAX/VISTARIL) 25 MG tablet Take 25 mg by mouth 3 (three) times daily as needed.    [provider]  ibuprofen (ADVIL,MOTRIN) 200 MG tablet Take 400 mg by mouth every 6 (six) hours as needed. For pain    [provider]    Allergies Patient has no known allergies.  History reviewed. No pertinent family history.  Social History Social History   Tobacco Use  . Smoking status: Current Some Day Smoker    Types: Cigarettes  . Smokeless tobacco: Never Used  Substance  Use Topics  . Alcohol use: No  . Drug use: No    Review of Systems Constitutional: No fever/chills Eyes: No visual changes. ENT: No sore throat. Cardiovascular: Denies chest pain. Respiratory: Denies shortness of breath. Gastrointestinal: No abdominal pain.  No nausea, no vomiting.  No diarrhea.  No constipation. Genitourinary: Negative for dysuria. Musculoskeletal: Negative for neck pain.  Negative for back pain.  Positive for right wrist pain Integumentary: Negative for rash. Neurological: Negative for headaches, focal weakness or numbness.   ____________________________________________   PHYSICAL EXAM:  VITAL SIGNS: ED Triage Vitals  Enc Vitals Group     BP 02/25/19 0318 108/74     Pulse Rate 02/25/19 0318 93     Resp 02/25/19 0318 18     Temp 02/25/19 0318 97.8 F (36.6 C)     Temp Source 02/25/19 0318 Oral     SpO2 02/25/19 0318 100 %     Weight 02/25/19 0317 65.8 kg (145 lb)     Height 02/25/19 0317 1.619 m (5' 3.75")     Head Circumference --      Peak Flow --      Pain Score 02/25/19 0317 10     Pain Loc --      Pain Edu? --      Excl. in GC? --     Constitutional: Alert and oriented.  Eyes: Conjunctivae are normal.  Head: Atraumatic. Mouth/Throat: Patient is wearing a mask. Neck: No stridor.  No meningeal signs.   Cardiovascular: Normal rate, regular rhythm. Good peripheral circulation. Grossly normal heart sounds. Respiratory: Normal respiratory effort.  No retractions. Gastrointestinal: Soft and nontender. No distention.  Musculoskeletal: Gross deformity of the right wrist swelling and discomfort. Neurologic:  Normal speech and language. No gross focal neurologic deficits are appreciated.  Skin:  Skin is warm, dry and intact. Psychiatric: Mood and affect are normal. Speech and behavior are normal.  __________________________________________  RADIOLOGY I, Cornelius N Fumiye Lubben, personally viewed and evaluated these images (plain radiographs) as part of  my medical decision making, as well as reviewing the written report by the radiologist.  ED MD interpretation: CT head revealed no acute intracranial abnormality.  Right wrist x-ray revealed acute intra-articular fracture of the distal radius with significant dorsal angulation of the fracture segment as well as mildly displaced ulnar styloid fracture.  Official radiology report(s): DG Wrist 2 Views Right  Result Date: 02/25/2019 CLINICAL DATA:  Pain EXAM: RIGHT WRIST - 2 VIEW COMPARISON:  None. FINDINGS: There is an acute displaced intra-articular fracture of the distal radius. There is significant dorsal angulation of the distal fracture fragment. There is a mildly displaced fracture of the ulnar styloid process. There is surrounding soft tissue swelling. IMPRESSION: 1. Acute intra-articular fracture of the distal radius with significant dorsal angulation of the distal fracture fragment. 2. Mildly displaced fracture of the ulnar styloid process. Electronically Signed   By: Katherine Mantlehristopher  Green M.D.   On: 02/25/2019 03:53   CT Head Wo Contrast  Result Date: 02/25/2019 CLINICAL DATA:  Fall. EXAM: CT HEAD WITHOUT CONTRAST TECHNIQUE: Contiguous axial images were obtained from the base of the skull through the vertex without intravenous contrast. COMPARISON:  None. FINDINGS: Brain: No acute intracranial hemorrhage. No focal mass lesion. No CT evidence of acute infarction. No midline shift or mass effect. No hydrocephalus. Basilar cisterns are patent. Vascular: No hyperdense vessel or unexpected calcification. Skull: Normal. Negative for fracture or focal lesion. Sinuses/Orbits: Paranasal sinuses and mastoid air cells are clear. Orbits are clear. Other: None. IMPRESSION: Normal head CT Electronically Signed   By: Genevive BiStewart  Edmunds M.D.   On: 02/25/2019 05:16    ____________________________________________   PROCEDURES    .Ortho Injury Treatment  Date/Time: 02/25/2019 5:55 AM Performed by: Darci CurrentBrown,  [place] N, MD Authorized by: Darci CurrentBrown, [place] N, MD   Consent:    Consent obtained:  Verbal   Consent given by:  PatientInjury location: wrist Location details: right wrist Injury type: fracture Fracture type: distal radius and ulnar styloid Pre-procedure neurovascular assessment: neurovascularly intact Pre-procedure distal perfusion: normal Pre-procedure neurological function: normal Pre-procedure range of motion: reduced  Anesthesia: Local anesthesia used: no  Patient sedated: NoManipulation performed: yes Immobilization: splint Splint type: sugar tong Post-procedure distal perfusion: normal Post-procedure neurological function: normal Post-procedure range of motion: normal      ____________________________________________   INITIAL IMPRESSION / MDM / ASSESSMENT AND PLAN / ED COURSE  As part of my medical decision making, I reviewed the following data within the electronic MEDICAL RECORD NUMBER   38 year old female presented with above-stated history and physical exam secondary to distal right ulnar and radius fracture.  Fracture attempted to be reduced while splint was being applied.  Patient discussed with Dr. Allena KatzPatel orthopedic surgeon on-call who recommended that the patient be referred to Dr. Rosita KeaMenz     ____________________________________________  FINAL CLINICAL IMPRESSION(S) / ED DIAGNOSES  Final diagnoses:  Closed fracture of distal ends of right radius and ulna, initial encounter  MEDICATIONS GIVEN DURING THIS VISIT:  Medications  oxyCODONE-acetaminophen (PERCOCET/ROXICET) 5-325 MG per tablet 1 tablet (1 tablet Oral Given 02/25/19 0335)  oxyCODONE-acetaminophen (PERCOCET/ROXICET) 5-325 MG per tablet 1 tablet (1 tablet Oral Given 02/25/19 0525)     ED Discharge Orders    None      *Please note:  Ismahan K Ruff was evaluated in Emergency Department on 02/25/2019 for the symptoms described in the history of present illness. She was evaluated in the  context of the global COVID-19 pandemic, which necessitated consideration that the patient might be at risk for infection with the SARS-CoV-2 virus that causes COVID-19. Institutional protocols and algorithms that pertain to the evaluation of patients at risk for COVID-19 are in a state of rapid change based on information released by regulatory bodies including the CDC and federal and state organizations. These policies and algorithms were followed during the patient's care in the ED.  Some ED evaluations and interventions may be delayed as a result of limited staffing during the pandemic.*  Note:  This document was prepared using Dragon voice recognition software and may include unintentional dictation errors.   Gregor Hams, MD 02/25/19 [phone]

## 2019-02-25 NOTE — ED Triage Notes (Signed)
Pt to triage via wheelchair. Reports she tripped over her dog and fell on her right arm and hit her head. Denies LOC. CMS intact to right arm.

## 2019-02-28 ENCOUNTER — Other Ambulatory Visit: Payer: Self-pay | Admitting: Orthopedic Surgery

## 2019-02-28 ENCOUNTER — Other Ambulatory Visit
Admission: RE | Admit: 2019-02-28 | Discharge: 2019-02-28 | Disposition: A | Discharge status: Home / Self Care | Payer: 59 | Source: Ambulatory Visit | Attending: Orthopedic Surgery | Admitting: Orthopedic Surgery

## 2019-02-28 DIAGNOSIS — Z20828 Contact with and (suspected) exposure to other viral communicable diseases: Secondary | ICD-10-CM | POA: Diagnosis not present

## 2019-02-28 DIAGNOSIS — Z01812 Encounter for preprocedural laboratory examination: Secondary | ICD-10-CM | POA: Diagnosis not present

## 2019-02-28 DIAGNOSIS — S52531A Colles' fracture of right radius, initial encounter for closed fracture: Secondary | ICD-10-CM | POA: Diagnosis not present

## 2019-02-28 LAB — SARS CORONAVIRUS 2 (TAT 6-24 HRS): SARS Coronavirus 2: NEGATIVE

## 2019-03-02 ENCOUNTER — Ambulatory Visit: Payer: 59 | Admitting: Certified Registered"

## 2019-03-02 ENCOUNTER — Other Ambulatory Visit: Payer: Self-pay

## 2019-03-02 ENCOUNTER — Encounter: Payer: Self-pay | Admitting: Orthopedic Surgery

## 2019-03-02 ENCOUNTER — Ambulatory Visit: Payer: 59

## 2019-03-02 ENCOUNTER — Ambulatory Visit
Admission: RE | Admit: 2019-03-02 | Discharge: 2019-03-02 | Disposition: A | Discharge status: Home / Self Care | Payer: 59 | Attending: Orthopedic Surgery | Admitting: Orthopedic Surgery

## 2019-03-02 ENCOUNTER — Encounter
Admission: RE | Disposition: A | Discharge status: Home / Self Care | Payer: Self-pay | Source: Home / Self Care | Attending: Orthopedic Surgery

## 2019-03-02 DIAGNOSIS — F419 Anxiety disorder, unspecified: Secondary | ICD-10-CM | POA: Diagnosis not present

## 2019-03-02 DIAGNOSIS — W1839XA Other fall on same level, initial encounter: Secondary | ICD-10-CM | POA: Diagnosis not present

## 2019-03-02 DIAGNOSIS — Z9889 Other specified postprocedural states: Secondary | ICD-10-CM

## 2019-03-02 DIAGNOSIS — Z8781 Personal history of (healed) traumatic fracture: Secondary | ICD-10-CM

## 2019-03-02 DIAGNOSIS — F17200 Nicotine dependence, unspecified, uncomplicated: Secondary | ICD-10-CM | POA: Insufficient documentation

## 2019-03-02 DIAGNOSIS — S52601A Unspecified fracture of lower end of right ulna, initial encounter for closed fracture: Secondary | ICD-10-CM | POA: Diagnosis not present

## 2019-03-02 DIAGNOSIS — S52501A Unspecified fracture of the lower end of right radius, initial encounter for closed fracture: Secondary | ICD-10-CM | POA: Insufficient documentation

## 2019-03-02 HISTORY — PX: OPEN REDUCTION INTERNAL FIXATION (ORIF) DISTAL RADIAL FRACTURE: SHX5989

## 2019-03-02 SURGERY — OPEN REDUCTION INTERNAL FIXATION (ORIF) DISTAL RADIUS FRACTURE
Anesthesia: General | Site: Wrist | Laterality: Right

## 2019-03-02 MED ORDER — PROPOFOL 10 MG/ML IV BOLUS
INTRAVENOUS | Status: DC | PRN
  Administered 2019-03-02: 170 mg via INTRAVENOUS

## 2019-03-02 MED ORDER — CEFAZOLIN SODIUM-DEXTROSE 2-4 GM/100ML-% IV SOLN
INTRAVENOUS | Status: AC
  Filled 2019-03-02: qty 100

## 2019-03-02 MED ORDER — METOCLOPRAMIDE HCL 5 MG/ML IJ SOLN: 5.0000 mg | Freq: Three times a day (TID) | INTRAMUSCULAR | Status: DC | PRN

## 2019-03-02 MED ORDER — ACETAMINOPHEN 10 MG/ML IV SOLN
INTRAVENOUS | Status: AC
  Filled 2019-03-02: qty 100

## 2019-03-02 MED ORDER — OXYCODONE-ACETAMINOPHEN 5-325 MG PO TABS: 1.0000 | ORAL_TABLET | ORAL | 0 refills | Status: DC | PRN

## 2019-03-02 MED ORDER — OXYCODONE-ACETAMINOPHEN 5-325 MG PO TABS
ORAL_TABLET | ORAL | Status: AC
  Administered 2019-03-02: 17:00:00 1
  Filled 2019-03-02: qty 1

## 2019-03-02 MED ORDER — ACETAMINOPHEN 10 MG/ML IV SOLN
INTRAVENOUS | Status: DC | PRN
  Administered 2019-03-02: 1000 mg via INTRAVENOUS

## 2019-03-02 MED ORDER — DEXAMETHASONE SODIUM PHOSPHATE 10 MG/ML IJ SOLN
INTRAMUSCULAR | Status: AC
  Filled 2019-03-02: qty 1

## 2019-03-02 MED ORDER — PROPOFOL 10 MG/ML IV BOLUS
INTRAVENOUS | Status: AC
  Filled 2019-03-02: qty 20

## 2019-03-02 MED ORDER — FENTANYL CITRATE (PF) 100 MCG/2ML IJ SOLN
INTRAMUSCULAR | Status: AC
  Administered 2019-03-02: 50 ug via INTRAVENOUS
  Filled 2019-03-02: qty 2

## 2019-03-02 MED ORDER — ONDANSETRON HCL 4 MG/2ML IJ SOLN: 4.0000 mg | Freq: Four times a day (QID) | INTRAMUSCULAR | Status: DC | PRN

## 2019-03-02 MED ORDER — FENTANYL CITRATE (PF) 100 MCG/2ML IJ SOLN
INTRAMUSCULAR | Status: DC | PRN
  Administered 2019-03-02 (×4): 25 ug via INTRAVENOUS

## 2019-03-02 MED ORDER — LIDOCAINE HCL (PF) 2 % IJ SOLN
INTRAMUSCULAR | Status: AC
  Filled 2019-03-02: qty 10

## 2019-03-02 MED ORDER — METOCLOPRAMIDE HCL 10 MG PO TABS: 5.0000 mg | ORAL_TABLET | Freq: Three times a day (TID) | ORAL | Status: DC | PRN

## 2019-03-02 MED ORDER — CHLORHEXIDINE GLUCONATE 4 % EX LIQD: 60.0000 mL | Freq: Once | CUTANEOUS | Status: DC

## 2019-03-02 MED ORDER — IBUPROFEN 600 MG PO TABS
600.0000 mg | ORAL_TABLET | Freq: Four times a day (QID) | ORAL | Status: DC | PRN
  Filled 2019-03-02 (×2): qty 1

## 2019-03-02 MED ORDER — FENTANYL CITRATE (PF) 100 MCG/2ML IJ SOLN
25.0000 ug | INTRAMUSCULAR | Status: DC | PRN
  Administered 2019-03-02: 16:00:00 50 ug via INTRAVENOUS
  Administered 2019-03-02: 16:00:00 25 ug via INTRAVENOUS

## 2019-03-02 MED ORDER — IBUPROFEN 600 MG PO TABS
ORAL_TABLET | ORAL | Status: AC
  Administered 2019-03-02: 17:00:00 600 mg via ORAL
  Filled 2019-03-02: qty 1

## 2019-03-02 MED ORDER — CEFAZOLIN SODIUM-DEXTROSE 2-4 GM/100ML-% IV SOLN
2.0000 g | INTRAVENOUS | Status: AC
  Administered 2019-03-02: 2 g via INTRAVENOUS

## 2019-03-02 MED ORDER — FAMOTIDINE 20 MG PO TABS: 20.0000 mg | ORAL_TABLET | Freq: Once | ORAL | Status: AC

## 2019-03-02 MED ORDER — PROMETHAZINE HCL 25 MG/ML IJ SOLN: 6.2500 mg | INTRAMUSCULAR | Status: DC | PRN

## 2019-03-02 MED ORDER — FENTANYL CITRATE (PF) 100 MCG/2ML IJ SOLN
INTRAMUSCULAR | Status: AC
  Filled 2019-03-02: qty 2

## 2019-03-02 MED ORDER — OXYCODONE-ACETAMINOPHEN 5-325 MG PO TABS: 1.0000 | ORAL_TABLET | ORAL | Status: DC | PRN

## 2019-03-02 MED ORDER — FAMOTIDINE 20 MG PO TABS
ORAL_TABLET | ORAL | Status: AC
  Administered 2019-03-02: 20 mg via ORAL
  Filled 2019-03-02: qty 1

## 2019-03-02 MED ORDER — DEXAMETHASONE SODIUM PHOSPHATE 10 MG/ML IJ SOLN
INTRAMUSCULAR | Status: DC | PRN
  Administered 2019-03-02: 10 mg via INTRAVENOUS

## 2019-03-02 MED ORDER — SODIUM CHLORIDE 0.9 % IV SOLN: INTRAVENOUS | Status: DC

## 2019-03-02 MED ORDER — ONDANSETRON HCL 4 MG/2ML IJ SOLN
INTRAMUSCULAR | Status: AC
  Filled 2019-03-02: qty 2

## 2019-03-02 MED ORDER — LACTATED RINGERS IV SOLN: INTRAVENOUS | Status: DC | PRN

## 2019-03-02 MED ORDER — LIDOCAINE HCL (CARDIAC) PF 100 MG/5ML IV SOSY
PREFILLED_SYRINGE | INTRAVENOUS | Status: DC | PRN
  Administered 2019-03-02: 60 mg via INTRAVENOUS

## 2019-03-02 MED ORDER — ONDANSETRON HCL 4 MG/2ML IJ SOLN
INTRAMUSCULAR | Status: DC | PRN
  Administered 2019-03-02: 4 mg via INTRAVENOUS

## 2019-03-02 MED ORDER — FENTANYL CITRATE (PF) 100 MCG/2ML IJ SOLN
INTRAMUSCULAR | Status: AC
  Administered 2019-03-02: 25 ug via INTRAVENOUS
  Filled 2019-03-02: qty 2

## 2019-03-02 MED ORDER — MIDAZOLAM HCL 2 MG/2ML IJ SOLN
INTRAMUSCULAR | Status: AC
  Filled 2019-03-02: qty 2

## 2019-03-02 MED ORDER — LACTATED RINGERS IV SOLN: INTRAVENOUS | Status: DC

## 2019-03-02 MED ORDER — MIDAZOLAM HCL 2 MG/2ML IJ SOLN
INTRAMUSCULAR | Status: DC | PRN
  Administered 2019-03-02: 2 mg via INTRAVENOUS

## 2019-03-02 MED ORDER — ONDANSETRON HCL 4 MG PO TABS: 4.0000 mg | ORAL_TABLET | Freq: Four times a day (QID) | ORAL | Status: DC | PRN

## 2019-03-02 SURGICAL SUPPLY — 45 items
APL PRP STRL LF DISP 70% ISPRP (MISCELLANEOUS) ×1
BIT DRILL 2 FAST STEP (BIT) ×1 IMPLANT
BIT DRILL 2.5X4 QC (BIT) ×1 IMPLANT
BNDG ELASTIC 4X5.8 VLCR STR LF (GAUZE/BANDAGES/DRESSINGS) ×2 IMPLANT
CANISTER SUCT 1200ML W/VALVE (MISCELLANEOUS) ×2 IMPLANT
CHLORAPREP W/TINT 26 (MISCELLANEOUS) ×2 IMPLANT
COVER WAND RF STERILE (DRAPES) ×2 IMPLANT
CUFF TOURN SGL QUICK 18X4 (TOURNIQUET CUFF) ×1 IMPLANT
DRAPE FLUOR MINI C-ARM 54X84 (DRAPES) ×2 IMPLANT
ELECT REM PT RETURN 9FT ADLT (ELECTROSURGICAL) ×2
ELECTRODE REM PT RTRN 9FT ADLT (ELECTROSURGICAL) ×1 IMPLANT
GAUZE SPONGE 4X4 12PLY STRL (GAUZE/BANDAGES/DRESSINGS) ×2 IMPLANT
GAUZE XEROFORM 1X8 LF (GAUZE/BANDAGES/DRESSINGS) ×4 IMPLANT
GLOVE SURG SYN 9.0  PF PI (GLOVE) ×1
GLOVE SURG SYN 9.0 PF PI (GLOVE) ×1 IMPLANT
GOWN SRG 2XL LVL 4 RGLN SLV (GOWNS) ×1 IMPLANT
GOWN STRL NON-REIN 2XL LVL4 (GOWNS) ×2
GOWN STRL REUS W/ TWL LRG LVL3 (GOWN DISPOSABLE) ×1 IMPLANT
GOWN STRL REUS W/TWL LRG LVL3 (GOWN DISPOSABLE) ×2
K-WIRE 1.6 (WIRE) ×2
K-WIRE FX5X1.6XNS BN SS (WIRE) ×1
KIT TURNOVER KIT A (KITS) ×2 IMPLANT
KWIRE FX5X1.6XNS BN SS (WIRE) IMPLANT
NDL FILTER BLUNT 18X1 1/2 (NEEDLE) ×1 IMPLANT
NEEDLE FILTER BLUNT 18X 1/2SAF (NEEDLE) ×1
NEEDLE FILTER BLUNT 18X1 1/2 (NEEDLE) ×1 IMPLANT
NS IRRIG 500ML POUR BTL (IV SOLUTION) ×2 IMPLANT
PACK EXTREMITY ARMC (MISCELLANEOUS) ×2 IMPLANT
PAD CAST CTTN 4X4 STRL (SOFTGOODS) ×2 IMPLANT
PADDING CAST COTTON 4X4 STRL (SOFTGOODS) ×4
PEG SUBCHONDRAL SMOOTH 2.0X14 (Peg) ×1 IMPLANT
PEG SUBCHONDRAL SMOOTH 2.0X16 (Peg) ×1 IMPLANT
PEG SUBCHONDRAL SMOOTH 2.0X18 (Peg) ×1 IMPLANT
PEG SUBCHONDRAL SMOOTH 2.0X20 (Peg) ×1 IMPLANT
PEG SUBCHONDRAL SMOOTH 2.0X22 (Peg) ×1 IMPLANT
PIN GUIDE .062 (PIN) IMPLANT
PLATE SHORT 24.4X51.3 RT (Plate) ×1 IMPLANT
SCALPEL PROTECTED #15 DISP (BLADE) ×4 IMPLANT
SCREW CORT 3.5X10 LNG (Screw) ×3 IMPLANT
SPLINT CAST 1 STEP 3X12 (MISCELLANEOUS) ×2 IMPLANT
SUT ETHILON 4-0 (SUTURE) ×2
SUT ETHILON 4-0 FS2 18XMFL BLK (SUTURE) ×1
SUT VICRYL 3-0 27IN (SUTURE) ×2 IMPLANT
SUTURE ETHLN 4-0 FS2 18XMF BLK (SUTURE) ×1 IMPLANT
SYR 3ML LL SCALE MARK (SYRINGE) ×2 IMPLANT

## 2019-03-02 NOTE — Op Note (Signed)
03/02/2019  3:40 PM  PATIENT:  Deanna Walton  38 y.o. female  PRE-OPERATIVE DIAGNOSIS:  CLOSED FRACTURE OF DISTAL ENDS OF RIGHT RADIUS AND ULNA  POST-OPERATIVE DIAGNOSIS:  CLOSED FRACTURE OF DISTAL END OF RIGHT DISTAL RADIUS  PROCEDURE:  Procedure(s): OPEN REDUCTION INTERNAL FIXATION (ORIF) DISTAL RADIAL FRACTURE (Right)  SURGEON: Laurene Footman, MD  ASSISTANTS: None  ANESTHESIA:   general  EBL:  Total I/O In: -  Out: 5 [Blood:5]  BLOOD ADMINISTERED:none  DRAINS: none   LOCAL MEDICATIONS USED:  NONE  SPECIMEN:  No Specimen  DISPOSITION OF SPECIMEN:  N/A  COUNTS:  YES  TOURNIQUET:   Total Tourniquet Time Documented: Upper Arm (Right) - 20 minutes Total: Upper Arm (Right) - 20 minutes   IMPLANTS: [employer] short narrow DVR plate with multiple smooth pegs and screws  DICTATION: .Dragon Dictation patient was brought to the operating room and after adequate anesthesia was obtained the right arm was prepped and draped in the usual sterile fashion.  After patient identification and timeout procedures were completed, tourniquet was raised.  A volar approach was made centered over the FCR tendon.  The tendon sheath was incised and the tendon retracted radially protect the radial artery and associated veins.  Incising the deep fascia the pronator was identified and elevated off the proximal fragment the distal fragment had already been stripped of the muscle.  Fingertrap traction had been applied at the start of the case with 10 pounds of traction and with gentle volar pressure anatomic alignment could be obtained.  Based on this a distal first approach was made with the plate and screws with smooth pegs placed distally and then a plate brought down to the shaft with 310 mm cortical screws placed.  With traction removed volar tilt length and radial inclination had all been restored essentially anatomically.  There was no distraction at the fracture site.  At this point the  tourniquet was let down and the wound irrigated and closed with 3-0 Vicryl subcutaneously and 4-0 nylon in a simple erupted fashion.  Xeroform 4 x 4 web roll and a volar splint applied followed by an Ace wrap.  PLAN OF CARE: Discharge to home after PACU  PATIENT DISPOSITION:  PACU - hemodynamically stable.

## 2019-03-02 NOTE — Anesthesia Post-op Follow-up Note (Signed)
Anesthesia QCDR form completed.        

## 2019-03-02 NOTE — Transfer of Care (Signed)
Immediate Anesthesia Transfer of Care Note  Patient: Deanna Walton  Procedure(s) Performed: OPEN REDUCTION INTERNAL FIXATION (ORIF) DISTAL RADIAL FRACTURE (Right Wrist)  Patient Location: PACU  Anesthesia Type:General  Level of Consciousness: drowsy  Airway & Oxygen Therapy: Patient Spontanous Breathing and Patient connected to face mask oxygen  Post-op Assessment: Report given to RN and Post -op Vital signs reviewed and stable  Post vital signs: Reviewed and stable  Last Vitals:  Vitals Value Taken Time  BP 147/90 03/02/19 1537  Temp    Pulse 64 03/02/19 1538  Resp 12 03/02/19 1538  SpO2 100 % 03/02/19 1538  Vitals shown include unvalidated device data.  Last Pain:  Vitals:   03/02/19 1403  TempSrc: Temporal  PainSc: 5          Complications: No apparent anesthesia complications

## 2019-03-02 NOTE — Anesthesia Procedure Notes (Signed)
Procedure Name: LMA Insertion Date/Time: 03/02/2019 2:51 PM Performed by: Jerrye Noble, CRNA Pre-anesthesia Checklist: Patient identified, Emergency Drugs available, Suction available and Patient being monitored Patient Re-evaluated:Patient Re-evaluated prior to induction Oxygen Delivery Method: Circle system utilized Preoxygenation: Pre-oxygenation with 100% oxygen Induction Type: IV induction Ventilation: Mask ventilation without difficulty LMA: LMA inserted LMA Size: 4.0 Placement Confirmation: positive ETCO2 Tube secured with: Tape Dental Injury: Teeth and Oropharynx as per pre-operative assessment

## 2019-03-02 NOTE — Discharge Instructions (Signed)
AMBULATORY SURGERY  DISCHARGE INSTRUCTIONS   1) The drugs that you were given will stay in your system until tomorrow so for the next 24 hours you should not:  A) Drive an automobile B) Make any legal decisions C) Drink any alcoholic beverage   2) You may resume regular meals tomorrow.  Today it is better to start with liquids and gradually work up to solid foods.  You may eat anything you prefer, but it is better to start with liquids, then soup and crackers, and gradually work up to solid foods.   3) Please notify your doctor immediately if you have any unusual bleeding, trouble breathing, redness and pain at the surgery site, drainage, fever, or pain not relieved by medication. 4)   5) Your post-operative visit with Dr.                                     is: Date:                        Time:    Please call to schedule your post-operative visit.  6) Additional Instructions:    Keep arm elevated is much as possible.  Work on finger range of motion is much as you can tolerate.  Leave the splint on clean and dry.  Pain medicine as directed.

## 2019-03-02 NOTE — H&P (Signed)
Reviewed paper H+P, will be scanned into chart. No changes noted.  

## 2019-03-02 NOTE — Anesthesia Preprocedure Evaluation (Addendum)
Anesthesia Evaluation  Patient identified by MRN, date of birth, ID band Patient awake    Reviewed: Allergy & Precautions, H&P , NPO status , Patient's Chart, lab work & pertinent test results, reviewed documented beta blocker date and time   History of Anesthesia Complications Negative for: history of anesthetic complications  Airway Mallampati: I  TM Distance: >3 FB Neck ROM: full    Dental  (+) Dental Advidsory Given, Teeth Intact, Caps   Pulmonary neg shortness of breath, neg recent URI, Current Smoker and Patient abstained from smoking.,    Pulmonary exam normal        Cardiovascular Exercise Tolerance: Good negative cardio ROS Normal cardiovascular exam     Neuro/Psych PSYCHIATRIC DISORDERS Anxiety negative neurological ROS     GI/Hepatic negative GI ROS, Neg liver ROS,   Endo/Other  negative endocrine ROS  Renal/GU negative Renal ROS  negative genitourinary   Musculoskeletal   Abdominal   Peds  Hematology negative hematology ROS (+)   Anesthesia Other Findings Past Medical History: No date: Anxiety   Reproductive/Obstetrics negative OB ROS                             Anesthesia Physical Anesthesia Plan  ASA: II  Anesthesia Plan: General   Post-op Pain Management:  Regional for Post-op pain   Induction: Intravenous  PONV Risk Score and Plan: 2 and Ondansetron, Dexamethasone, Midazolam, Treatment may vary due to age or medical condition and Promethazine  Airway Management Planned: LMA  Additional Equipment:   Intra-op Plan:   Post-operative Plan: Extubation in OR  Informed Consent: I have reviewed the patients History and Physical, chart, labs and discussed the procedure including the risks, benefits and alternatives for the proposed anesthesia with the patient or authorized representative who has indicated his/her understanding and acceptance.     Dental Advisory  Given  Plan Discussed with: Anesthesiologist, CRNA and Surgeon  Anesthesia Plan Comments:        Anesthesia Quick Evaluation

## 2019-03-03 LAB — POCT PREGNANCY, URINE: Preg Test, Ur: NEGATIVE

## 2019-03-03 NOTE — Anesthesia Postprocedure Evaluation (Addendum)
Anesthesia Post Note  Patient: Research scientist (physical sciences)  Procedure(s) Performed: OPEN REDUCTION INTERNAL FIXATION (ORIF) DISTAL RADIAL FRACTURE (Right Wrist)  Patient location during evaluation: PACU Anesthesia Type: General Level of consciousness: awake and alert Pain management: pain level controlled Vital Signs Assessment: post-procedure vital signs reviewed and stable Respiratory status: spontaneous breathing, nonlabored ventilation, respiratory function stable and patient connected to nasal cannula oxygen Cardiovascular status: blood pressure returned to baseline and stable Postop Assessment: no apparent nausea or vomiting Anesthetic complications: no     Last Vitals:  Vitals:   03/02/19 1639 03/02/19 1710  BP: 140/86 129/76  Pulse: 60 (!) 57  Resp: 18 16  Temp: 36.7 C   SpO2: 100% 100%    Last Pain:  Vitals:   03/03/19 0936  TempSrc:   PainSc: 2                  Martha Clan

## 2019-03-06 DIAGNOSIS — Z9889 Other specified postprocedural states: Secondary | ICD-10-CM | POA: Diagnosis not present

## 2019-03-06 DIAGNOSIS — Z8781 Personal history of (healed) traumatic fracture: Secondary | ICD-10-CM | POA: Diagnosis not present

## 2019-03-20 DIAGNOSIS — S52531D Colles' fracture of right radius, subsequent encounter for closed fracture with routine healing: Secondary | ICD-10-CM | POA: Diagnosis not present

## 2019-04-19 DIAGNOSIS — S52531D Colles' fracture of right radius, subsequent encounter for closed fracture with routine healing: Secondary | ICD-10-CM | POA: Diagnosis not present

## 2019-05-16 DIAGNOSIS — N760 Acute vaginitis: Secondary | ICD-10-CM | POA: Diagnosis not present

## 2019-05-16 DIAGNOSIS — Z209 Contact with and (suspected) exposure to unspecified communicable disease: Secondary | ICD-10-CM | POA: Diagnosis not present

## 2019-09-15 DIAGNOSIS — Z3040 Encounter for surveillance of contraceptives, unspecified: Secondary | ICD-10-CM | POA: Diagnosis not present

## 2019-09-23 ENCOUNTER — Encounter: Payer: Self-pay | Admitting: Emergency Medicine

## 2019-09-23 ENCOUNTER — Emergency Department
Admission: EM | Admit: 2019-09-23 | Discharge: 2019-09-23 | Disposition: A | Discharge status: Home / Self Care | Payer: 59 | Attending: Emergency Medicine | Admitting: Emergency Medicine

## 2019-09-23 ENCOUNTER — Other Ambulatory Visit: Payer: Self-pay

## 2019-09-23 DIAGNOSIS — F17210 Nicotine dependence, cigarettes, uncomplicated: Secondary | ICD-10-CM | POA: Diagnosis not present

## 2019-09-23 DIAGNOSIS — Z1152 Encounter for screening for COVID-19: Secondary | ICD-10-CM

## 2019-09-23 DIAGNOSIS — Z20822 Contact with and (suspected) exposure to covid-19: Secondary | ICD-10-CM | POA: Diagnosis not present

## 2019-09-23 DIAGNOSIS — B349 Viral infection, unspecified: Secondary | ICD-10-CM | POA: Insufficient documentation

## 2019-09-23 DIAGNOSIS — R5383 Other fatigue: Secondary | ICD-10-CM | POA: Diagnosis not present

## 2019-09-23 DIAGNOSIS — R05 Cough: Secondary | ICD-10-CM | POA: Diagnosis not present

## 2019-09-23 LAB — SARS CORONAVIRUS 2 BY RT PCR (HOSPITAL ORDER, PERFORMED IN ~~LOC~~ HOSPITAL LAB): SARS Coronavirus 2: NEGATIVE

## 2019-09-23 NOTE — ED Provider Notes (Signed)
[facility] Emergency Department Provider Note  ____________________________________________   First MD Initiated Contact with Patient 09/23/19 [phone]     (approximate)  I have reviewed the triage vital signs and the nursing notes.   HISTORY  Chief Complaint Covid Exposure   HPI Deanna Walton is a 39 y.o. female presents to the ED with complaint of viral-like symptoms. Patient states that she feels fatigued with a slight cough. Patient is a smoker and also vapes. Patient states that her boyfriend took 3 at home Covid test which were all positive. Patient denies having the Covid vaccine. Patient works for [employer] on third shift.  She denies any pain at this time.       Past Medical History:  Diagnosis Date  . Anxiety     There are no problems to display for this patient.   Past Surgical History:  Procedure Laterality Date  . OPEN REDUCTION INTERNAL FIXATION (ORIF) DISTAL RADIAL FRACTURE Right 03/02/2019   Procedure: OPEN REDUCTION INTERNAL FIXATION (ORIF) DISTAL RADIAL FRACTURE;  Surgeon: Kennedy Bucker, MD;  Location: [facility] ORS;  Service: Orthopedics;  Laterality: Right;    Prior to Admission medications   Not on File    Allergies Patient has no known allergies.  History reviewed. No pertinent family history.  Social History Social History   Tobacco Use  . Smoking status: Current Some Day Smoker    Types: Cigarettes  . Smokeless tobacco: Never Used  Substance Use Topics  . Alcohol use: No  . Drug use: No    Review of Systems Constitutional: No fever/chills.  Positive fatigue. Eyes: No visual changes. ENT: No sore throat.  Negative for loss of taste or smell. Cardiovascular: Denies chest pain. Respiratory: Denies shortness of breath.  Positive occasional cough. Gastrointestinal: No abdominal pain.  No nausea, no vomiting.  No diarrhea.   Genitourinary: Negative for dysuria. Musculoskeletal: Negative for back pain. Skin: Negative for  rash. Neurological: Negative for headaches, focal weakness or numbness. ____________________________________________   PHYSICAL EXAM:  VITAL SIGNS: ED Triage Vitals  Enc Vitals Group     BP 09/23/19 0840 104/62     Pulse Rate 09/23/19 0840 72     Resp 09/23/19 0840 15     Temp 09/23/19 0840 98.1 F (36.7 C)     Temp Source 09/23/19 0840 Oral     SpO2 09/23/19 0840 100 %     Weight 09/23/19 0824 142 lb (64.4 kg)     Height 09/23/19 0824 5\' 3"  (1.6 m)     Head Circumference --      Peak Flow --      Pain Score 09/23/19 0824 0     Pain Loc --      Pain Edu? --      Excl. in GC? --     Constitutional: Alert and oriented. Well appearing and in no acute distress. Eyes: Conjunctivae are normal. PERRL. EOMI. Head: Atraumatic. Nose: No congestion/rhinnorhea.  EACs and TMs are clear bilaterally. Neck: No stridor.   Hematological/Lymphatic/Immunilogical: No cervical lymphadenopathy. Cardiovascular: Normal rate, regular rhythm. Grossly normal heart sounds.  Good peripheral circulation. Respiratory: Normal respiratory effort.  No retractions. Lungs CTAB. Musculoskeletal: Moves upper and lower extremities that any difficulty.  Normal gait was noted. Neurologic:  Normal speech and language. No gross focal neurologic deficits are appreciated.  Skin:  Skin is warm, dry and intact. No rash noted. Psychiatric: Mood and affect are normal. Speech and behavior are normal.  ____________________________________________   LABS (  all labs ordered are listed, but only abnormal results are displayed)  Labs Reviewed  SARS CORONAVIRUS 2 BY RT PCR (HOSPITAL ORDER, PERFORMED IN [facility])     PROCEDURES  Procedure(s) performed (including Critical Care):  Procedures   ____________________________________________   INITIAL IMPRESSION / ASSESSMENT AND PLAN / ED COURSE  As part of my medical decision making, I reviewed the following data within the electronic MEDICAL RECORD NUMBER  Notes from prior ED visits and [place] Controlled Substance Database  Gaynelle K Tison was evaluated in Emergency Department on 09/23/2019 for the symptoms described in the history of present illness. She was evaluated in the context of the global COVID-19 pandemic, which necessitated consideration that the patient might be at risk for infection with the SARS-CoV-2 virus that causes COVID-19. Institutional protocols and algorithms that pertain to the evaluation of patients at risk for COVID-19 are in a state of rapid change based on information released by regulatory bodies including the CDC and federal and state organizations. These policies and algorithms were followed during the patient's care in the ED.  39 year old female presents to the ED with concerns of Covid.  She states her boyfriend tested positive with the at home test.  Patient denies any fever but states that she does feel fatigued with slight cough.  She states that last night she "felt run over by a freight train".  Physical exam was benign.  Covid test was done while patient was in the ED and resulted.  She is aware that her test is negative and a copy of the results was given to her to give to work.  She is encouraged still to increase fluids and Tylenol or ibuprofen if needed for what sounds like a viral syndrome. ____________________________________________   FINAL CLINICAL IMPRESSION(S) / ED DIAGNOSES  Final diagnoses:  Encounter for screening for COVID-19  Viral illness     ED Discharge Orders    None       Note:  This document was prepared using Dragon voice recognition software and may include unintentional dictation errors.    Tommi Rumps, PA-C 09/23/19 1122    Arnaldo Natal, MD 09/23/19 [phone]

## 2019-09-23 NOTE — Discharge Instructions (Signed)
Follow-up with your primary care provider if any continued problems by calling. Your results should be on my chart before the end of the day. If your test is positive you will need to quarantine for approximately 10 days. Also because you have been exposed to your boyfriend who currently is positive you need to quarantine for 10 days. Tylenol or ibuprofen if needed for body aches or fever. Increase fluids. Decrease smoking. Return to the emergency department if any shortness of breath or difficulty breathing. There is no medication at this time for Covid.

## 2019-09-23 NOTE — ED Notes (Signed)
See triage note  States that her b/f tested positive for COVID after being around someone that was positive  Denies any fever  But states she feels fatigued with slight cough  States she is a smoker and a vapor user   Afebrile on arrival

## 2019-09-23 NOTE — ED Triage Notes (Signed)
Pt states boyfriend has Covid and last night she felt "like I'd been run over by a freight train".  Pt denies cough, fever, or other s/s at this time.

## 2019-11-29 ENCOUNTER — Other Ambulatory Visit: Payer: Self-pay | Admitting: Nurse Practitioner

## 2019-11-29 DIAGNOSIS — N6310 Unspecified lump in the right breast, unspecified quadrant: Secondary | ICD-10-CM

## 2019-11-29 DIAGNOSIS — N6320 Unspecified lump in the left breast, unspecified quadrant: Secondary | ICD-10-CM

## 2020-02-23 ENCOUNTER — Encounter: Payer: Self-pay | Admitting: Emergency Medicine

## 2020-02-23 ENCOUNTER — Emergency Department
Admission: EM | Admit: 2020-02-23 | Discharge: 2020-02-23 | Disposition: A | Discharge status: Home / Self Care | Payer: Medicaid Other | Attending: Emergency Medicine | Admitting: Emergency Medicine

## 2020-02-23 ENCOUNTER — Emergency Department: Payer: Medicaid Other

## 2020-02-23 ENCOUNTER — Other Ambulatory Visit: Payer: Self-pay

## 2020-02-23 DIAGNOSIS — R002 Palpitations: Secondary | ICD-10-CM

## 2020-02-23 DIAGNOSIS — K529 Noninfective gastroenteritis and colitis, unspecified: Secondary | ICD-10-CM | POA: Insufficient documentation

## 2020-02-23 DIAGNOSIS — F17210 Nicotine dependence, cigarettes, uncomplicated: Secondary | ICD-10-CM | POA: Insufficient documentation

## 2020-02-23 DIAGNOSIS — M791 Myalgia, unspecified site: Secondary | ICD-10-CM | POA: Insufficient documentation

## 2020-02-23 DIAGNOSIS — E876 Hypokalemia: Secondary | ICD-10-CM

## 2020-02-23 LAB — BASIC METABOLIC PANEL
Anion gap: 10 (ref 5–15)
BUN: 13 mg/dL (ref 6–20)
CO2: 23 mmol/L (ref 22–32)
Calcium: 9 mg/dL (ref 8.9–10.3)
Chloride: 102 mmol/L (ref 98–111)
Creatinine, Ser: 0.58 mg/dL (ref 0.44–1.00)
GFR, Estimated: >60 mL/min (ref >60)
Glucose, Bld: 102 mg/dL — ABNORMAL HIGH (ref 70–99)
Potassium: 3.4 mmol/L — ABNORMAL LOW (ref 3.5–5.1)
Sodium: 135 mmol/L (ref 135–145)

## 2020-02-23 LAB — CBC
HCT: 37.2 % (ref 36.0–46.0)
Hemoglobin: 12.6 g/dL (ref 12.0–15.0)
MCH: 32 pg (ref 26.0–34.0)
MCHC: 33.9 g/dL (ref 30.0–36.0)
MCV: 94.4 fL (ref 80.0–100.0)
Platelets: 280 10*3/uL (ref 150–400)
RBC: 3.94 MIL/uL (ref 3.87–5.11)
RDW: 12.8 % (ref 11.5–15.5)
WBC: 10 10*3/uL (ref 4.0–10.5)
nRBC: 0 % (ref 0.0–0.2)

## 2020-02-23 LAB — POC URINE PREG, ED: Preg Test, Ur: NEGATIVE

## 2020-02-23 LAB — TROPONIN I (HIGH SENSITIVITY): Troponin I (High Sensitivity): <2 ng/L (ref <18)

## 2020-02-23 MED ORDER — POTASSIUM CHLORIDE CRYS ER 20 MEQ PO TBCR
40.0000 meq | EXTENDED_RELEASE_TABLET | Freq: Once | ORAL | Status: AC
  Administered 2020-02-23: 40 meq via ORAL
  Filled 2020-02-23: qty 2

## 2020-02-23 NOTE — ED Provider Notes (Signed)
[facility] Emergency Department Provider Note  ____________________________________________   Event Date/Time   First MD Initiated Contact with Patient 02/23/20 [phone]     (approximate)  I have reviewed the triage vital signs and the nursing notes.   HISTORY  Chief Complaint Palpitations and Dizziness   HPI Agnes K Milewski is a 39 y.o. 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  Patient denies any headache, vertigo, vision changes, cough, shortness of breath, chest pain, urinary symptoms, recent falls or injuries, rash or other acute sick symptoms.  She does note she vapes and does drink a fair amount of caffeine.         Past Medical History:  Diagnosis Date  . Anxiety     There are no problems to display for this patient.   Past Surgical History:  Procedure Laterality Date  . OPEN REDUCTION INTERNAL FIXATION (ORIF) DISTAL RADIAL FRACTURE Right 03/02/2019   Procedure: OPEN REDUCTION INTERNAL FIXATION (ORIF) DISTAL RADIAL FRACTURE;  Surgeon: Kennedy Bucker, MD;  Location: [facility] ORS;  Service: Orthopedics;  Laterality: Right;    Prior to Admission medications   Not on File    Allergies Patient has no known allergies.  History reviewed. No pertinent family history.  Social History Social History   Tobacco Use  . Smoking status: Current Some Day Smoker    Types: Cigarettes  . Smokeless tobacco: Never Used  Substance Use Topics  . Alcohol use: No  . Drug use: No    Review of Systems  Review of Systems  Constitutional: Negative for chills and fever.  HENT: Negative for sore  throat.   Eyes: Negative for pain.  Respiratory: Negative for cough and stridor.   Cardiovascular: Positive for palpitations. Negative for chest pain.  Gastrointestinal: Negative for vomiting.  Skin: Negative for rash.  Neurological: Positive for dizziness. Negative for seizures, loss of consciousness and headaches.  Psychiatric/Behavioral: Negative for suicidal ideas.  All other systems reviewed and are negative.     ____________________________________________   PHYSICAL EXAM:  VITAL SIGNS: ED Triage Vitals  Enc Vitals Group     BP 02/23/20 0809 (!) 121/42     Pulse Rate 02/23/20 0809 92     Resp 02/23/20 0809 20     Temp 02/23/20 0809 99 F (37.2 C)     Temp Source 02/23/20 0809 Oral     SpO2 02/23/20 0809 99 %     Weight 02/23/20 0807 150 lb (68 kg)     Height 02/23/20 0807 5' 3.75" (1.619 m)     Head Circumference --      Peak Flow --      Pain Score 02/23/20 0807 0     Pain Loc --      Pain Edu? --      Excl. in GC? --    Vitals:   02/23/20 0809  BP: (!) 121/42  Pulse: 92  Resp: 20  Temp: 99 F (37.2 C)  SpO2: 99%   Physical Exam Vitals and nursing note reviewed.  Constitutional:      General: She is not  in acute distress.    Appearance: She is well-developed and well-nourished.  HENT:     Head: Normocephalic and atraumatic.     Right Ear: External ear normal.     Left Ear: External ear normal.     Nose: Nose normal.  Eyes:     Conjunctiva/sclera: Conjunctivae normal.  Cardiovascular:     Rate and Rhythm: Normal rate and regular rhythm.     Heart sounds: No murmur heard.   Pulmonary:     Effort: Pulmonary effort is normal. No respiratory distress.     Breath sounds: Normal breath sounds.  Abdominal:     Palpations: Abdomen is soft.     Tenderness: There is no abdominal tenderness. There is no right CVA tenderness or left CVA tenderness.  Musculoskeletal:        General: No edema.     Cervical back: Neck supple.  Skin:    General: Skin is  warm and dry.  Neurological:     Mental Status: She is alert and oriented to person, place, and time.  Psychiatric:        Mood and Affect: Mood and affect and mood normal.     Cranial nerves II through XII grossly intact.  Patient has symmetric strength in all extremities and is noted to ambulate with steady gait unassisted.   ____________________________________________   LABS (all labs ordered are listed, but only abnormal results are displayed)  Labs Reviewed  BASIC METABOLIC PANEL - Abnormal; Notable for the following components:      Result Value   Potassium 3.4 (*)    Glucose, Bld 102 (*)    All other components within normal limits  CBC  POC URINE PREG, ED  TROPONIN I (HIGH SENSITIVITY)  TROPONIN I (HIGH SENSITIVITY)   ____________________________________________  EKG  Sinus rhythm with ventricular rate of 91, normal axis, unremarkable intervals, no evidence of acute ischemia or significant arrhythmia. ____________________________________________  RADIOLOGY  ED MD interpretation: No focal consolidation, pneumothorax, edema, large effusion or acute intrathoracic process  Official radiology report(s): DG Chest 2 View  Result Date: 02/23/2020 CLINICAL DATA:  Palpitations. EXAM: CHEST - 2 VIEW COMPARISON:  03/22/2017 FINDINGS: The cardiomediastinal silhouette is within normal limits. The lungs are well inflated and clear. There is no evidence of pleural effusion or pneumothorax. There is mild thoracolumbar levoscoliosis. IMPRESSION: No active cardiopulmonary disease. Electronically Signed   By: Sebastian Ache M.D.   On: 02/23/2020 08:49    ____________________________________________   PROCEDURES  Procedure(s) performed (including Critical Care):  Procedures   ____________________________________________   INITIAL IMPRESSION / ASSESSMENT AND PLAN / ED COURSE      Patient presents with [place] to history exam for assessment of multiple complaints including  some palpitations, hypokalemia, several years of diarrhea, and some backaches that have been on and off over the last several weeks but are not present today.  Patient is afebrile hemodynamically stable arrival.  Chest x-ray is unremarkable and has no evidence of pneumothorax, pneumonia edema or other clear acute thoracic process.  EKG shows no evidence of arrhythmia or ischemia.  Troponin is within normal limits and overall given duration of symptoms with reassuring EKG low suspicion for ACS or myocarditis.  CBC is unremarkable.  No evidence of acute symptomatic anemia.  BMP remarkable for K of 3.4 with no other significant electrolyte or metabolic derangements.  No fever CVA tenderness urinary symptoms to suggest [place] or cystitis.  Overall unclear etiology for patient's symptoms although given otherwise reassuring exam,  work-up, and vital signs with duration of symptoms are greater than several weeks of which is a for discharge with plan for close outpatient PCP follow-up.  Patient given 1 dose of potassium in the ED.  Discharge stable condition.  Return cautions advised discussed       ____________________________________________   FINAL CLINICAL IMPRESSION(S) / ED DIAGNOSES  Final diagnoses:  Palpitations  Hypokalemia  Chronic diarrhea  Myalgia    Medications  potassium chloride SA (KLOR-CON) CR tablet 40 mEq (has no administration in time range)     ED Discharge Orders    None       Note:  This document was prepared using Dragon voice recognition software and may include unintentional dictation errors.   Gilles Chiquito, MD 02/23/20 1006

## 2020-02-23 NOTE — ED Notes (Signed)
Assessed by provider prior to discharge. Pt signed paper copy of d/c, follow up instruction

## 2020-02-23 NOTE — ED Triage Notes (Signed)
Pt states recently ran her blood, states her K was 3.0, pt states has been feeling like she is having palpitations, states near syncopal episode 2 days ago. Pt also c/o back pain and increased fatigue. Pt presents A&O x4, NAD noted in triage.

## 2020-03-23 ENCOUNTER — Emergency Department
Admission: EM | Admit: 2020-03-23 | Discharge: 2020-03-24 | Disposition: A | Discharge status: Home / Self Care | Payer: Medicaid Other | Attending: Emergency Medicine | Admitting: Emergency Medicine

## 2020-03-23 ENCOUNTER — Other Ambulatory Visit: Payer: Self-pay

## 2020-03-23 DIAGNOSIS — R0789 Other chest pain: Secondary | ICD-10-CM | POA: Insufficient documentation

## 2020-03-23 DIAGNOSIS — Z5321 Procedure and treatment not carried out due to patient leaving prior to being seen by health care provider: Secondary | ICD-10-CM | POA: Insufficient documentation

## 2020-03-23 LAB — BASIC METABOLIC PANEL
Anion gap: 9 (ref 5–15)
BUN: 8 mg/dL (ref 6–20)
CO2: 25 mmol/L (ref 22–32)
Calcium: 9.2 mg/dL (ref 8.9–10.3)
Chloride: 103 mmol/L (ref 98–111)
Creatinine, Ser: 0.57 mg/dL (ref 0.44–1.00)
GFR, Estimated: >60 mL/min (ref >60)
Glucose, Bld: 98 mg/dL (ref 70–99)
Potassium: 3.8 mmol/L (ref 3.5–5.1)
Sodium: 137 mmol/L (ref 135–145)

## 2020-03-23 LAB — TROPONIN I (HIGH SENSITIVITY): Troponin I (High Sensitivity): 3 ng/L (ref <18)

## 2020-03-23 LAB — CBC
HCT: 41.8 % (ref 36.0–46.0)
Hemoglobin: 14.3 g/dL (ref 12.0–15.0)
MCH: 31.8 pg (ref 26.0–34.0)
MCHC: 34.2 g/dL (ref 30.0–36.0)
MCV: 92.9 fL (ref 80.0–100.0)
Platelets: 266 10*3/uL (ref 150–400)
RBC: 4.5 MIL/uL (ref 3.87–5.11)
RDW: 12.5 % (ref 11.5–15.5)
WBC: 5.8 10*3/uL (ref 4.0–10.5)
nRBC: 0 % (ref 0.0–0.2)

## 2020-03-23 LAB — POC URINE PREG, ED: Preg Test, Ur: NEGATIVE

## 2020-03-23 NOTE — ED Triage Notes (Signed)
Pt arrives pov w cc of CP. Pt states she took a deep breath in her car and upper left chest pain under breast area. Pt states she feels it when taking a deep breath and is intermittent. 3/10 pressure, states it's eased off now. Denies shob, cough, fever

## 2020-03-24 ENCOUNTER — Emergency Department: Payer: Medicaid Other

## 2020-09-16 ENCOUNTER — Encounter: Payer: Self-pay | Admitting: Emergency Medicine

## 2020-09-16 ENCOUNTER — Other Ambulatory Visit: Payer: Self-pay

## 2020-09-16 ENCOUNTER — Emergency Department
Admission: EM | Admit: 2020-09-16 | Discharge: 2020-09-16 | Disposition: A | Discharge status: Home / Self Care | Payer: Medicaid Other | Attending: Emergency Medicine | Admitting: Emergency Medicine

## 2020-09-16 DIAGNOSIS — R5383 Other fatigue: Secondary | ICD-10-CM | POA: Insufficient documentation

## 2020-09-16 DIAGNOSIS — Z20822 Contact with and (suspected) exposure to covid-19: Secondary | ICD-10-CM | POA: Insufficient documentation

## 2020-09-16 DIAGNOSIS — R5381 Other malaise: Secondary | ICD-10-CM

## 2020-09-16 DIAGNOSIS — F17210 Nicotine dependence, cigarettes, uncomplicated: Secondary | ICD-10-CM | POA: Insufficient documentation

## 2020-09-16 DIAGNOSIS — R519 Headache, unspecified: Secondary | ICD-10-CM | POA: Insufficient documentation

## 2020-09-16 LAB — CBC WITH DIFFERENTIAL/PLATELET
Abs Immature Granulocytes: 0.03 10*3/uL (ref 0.00–0.07)
Basophils Absolute: 0.1 10*3/uL (ref 0.0–0.1)
Basophils Relative: 1 %
Eosinophils Absolute: 0.1 10*3/uL (ref 0.0–0.5)
Eosinophils Relative: 2 %
HCT: 37.6 % (ref 36.0–46.0)
Hemoglobin: 13.1 g/dL (ref 12.0–15.0)
Immature Granulocytes: 0 %
Lymphocytes Relative: 34 %
Lymphs Abs: 2.8 10*3/uL (ref 0.7–4.0)
MCH: 32.2 pg (ref 26.0–34.0)
MCHC: 34.8 g/dL (ref 30.0–36.0)
MCV: 92.4 fL (ref 80.0–100.0)
Monocytes Absolute: 0.5 10*3/uL (ref 0.1–1.0)
Monocytes Relative: 7 %
Neutro Abs: 4.7 10*3/uL (ref 1.7–7.7)
Neutrophils Relative %: 56 %
Platelets: 279 10*3/uL (ref 150–400)
RBC: 4.07 MIL/uL (ref 3.87–5.11)
RDW: 12.2 % (ref 11.5–15.5)
WBC: 8.2 10*3/uL (ref 4.0–10.5)
nRBC: 0 % (ref 0.0–0.2)

## 2020-09-16 LAB — COMPREHENSIVE METABOLIC PANEL
ALT: 27 U/L (ref 0–44)
AST: 21 U/L (ref 15–41)
Albumin: 4.1 g/dL (ref 3.5–5.0)
Alkaline Phosphatase: 58 U/L (ref 38–126)
Anion gap: 10 (ref 5–15)
BUN: 10 mg/dL (ref 6–20)
CO2: 22 mmol/L (ref 22–32)
Calcium: 9.3 mg/dL (ref 8.9–10.3)
Chloride: 103 mmol/L (ref 98–111)
Creatinine, Ser: 0.71 mg/dL (ref 0.44–1.00)
GFR, Estimated: >60 mL/min (ref >60)
Glucose, Bld: 100 mg/dL — ABNORMAL HIGH (ref 70–99)
Potassium: 3.9 mmol/L (ref 3.5–5.1)
Sodium: 135 mmol/L (ref 135–145)
Total Bilirubin: 0.6 mg/dL (ref 0.3–1.2)
Total Protein: 7.6 g/dL (ref 6.5–8.1)

## 2020-09-16 LAB — CK: Total CK: 31 U/L — ABNORMAL LOW (ref 38–234)

## 2020-09-16 LAB — RESP PANEL BY RT-PCR (FLU A&B, COVID) ARPGX2
Influenza A by PCR: NEGATIVE
Influenza B by PCR: NEGATIVE
SARS Coronavirus 2 by RT PCR: NEGATIVE

## 2020-09-16 LAB — TSH: TSH: 1.353 u[IU]/mL (ref 0.350–4.500)

## 2020-09-16 NOTE — Discharge Instructions (Addendum)
Your lab test today were all okay.  Your COVID and flu PCR tests were negative.  Please follow-up with your doctor for continued evaluation of your symptoms.  Results for orders placed or performed during the hospital encounter of 09/16/20  Resp Panel by RT-PCR (Flu A&B, Covid) Nasopharyngeal Swab   Specimen: Nasopharyngeal Swab; Nasopharyngeal(NP) swabs in vial transport medium  Result Value Ref Range   SARS Coronavirus 2 by RT PCR NEGATIVE NEGATIVE   Influenza A by PCR NEGATIVE NEGATIVE   Influenza B by PCR NEGATIVE NEGATIVE  Comprehensive metabolic panel  Result Value Ref Range   Sodium 135 135 - 145 mmol/L   Potassium 3.9 3.5 - 5.1 mmol/L   Chloride 103 98 - 111 mmol/L   CO2 22 22 - 32 mmol/L   Glucose, Bld 100 (H) 70 - 99 mg/dL   BUN 10 6 - 20 mg/dL   Creatinine, Ser 4.08 0.44 - 1.00 mg/dL   Calcium 9.3 8.9 - 14.4 mg/dL   Total Protein 7.6 6.5 - 8.1 g/dL   Albumin 4.1 3.5 - 5.0 g/dL   AST 21 15 - 41 U/L   ALT 27 0 - 44 U/L   Alkaline Phosphatase 58 38 - 126 U/L   Total Bilirubin 0.6 0.3 - 1.2 mg/dL   GFR, Estimated >81 >85 mL/min   Anion gap 10 5 - 15  CBC with Differential  Result Value Ref Range   WBC 8.2 4.0 - 10.5 K/uL   RBC 4.07 3.87 - 5.11 MIL/uL   Hemoglobin 13.1 12.0 - 15.0 g/dL   HCT 63.1 49.7 - 02.6 %   MCV 92.4 80.0 - 100.0 fL   MCH 32.2 26.0 - 34.0 pg   MCHC 34.8 30.0 - 36.0 g/dL   RDW 37.8 58.8 - 50.2 %   Platelets 279 150 - 400 K/uL   nRBC 0.0 0.0 - 0.2 %   Neutrophils Relative % 56 %   Neutro Abs 4.7 1.7 - 7.7 K/uL   Lymphocytes Relative 34 %   Lymphs Abs 2.8 0.7 - 4.0 K/uL   Monocytes Relative 7 %   Monocytes Absolute 0.5 0.1 - 1.0 K/uL   Eosinophils Relative 2 %   Eosinophils Absolute 0.1 0.0 - 0.5 K/uL   Basophils Relative 1 %   Basophils Absolute 0.1 0.0 - 0.1 K/uL   Immature Granulocytes 0 %   Abs Immature Granulocytes 0.03 0.00 - 0.07 K/uL  TSH  Result Value Ref Range   TSH 1.353 0.350 - 4.500 uIU/mL  CK  Result Value Ref Range   Total  CK 31 (L) 38 - 234 U/L   No results found.

## 2020-09-16 NOTE — ED Notes (Signed)
See triage note  Presents with body aches with some weakness and fatigue  States she feels like when she had COVID   Also had a tick removed about 1 month ago  Afebrile on arrival   No rash noted

## 2020-09-16 NOTE — ED Triage Notes (Signed)
Pt via POV from home. Pt c/o headache, fatigue, and generalized body aches since thursday. Pt states she was around someone COVID positive. Pt states she had COVID in Jan, and she felt similar to how she feels now. Pt thinks it also may be Lyme Disease, says she pulled a tick out a month ago.

## 2020-09-16 NOTE — ED Provider Notes (Signed)
[facility] Emergency Department Provider Note  ____________________________________________  Time seen: Approximately 2:15 PM  I have reviewed the triage vital signs and the nursing notes.   HISTORY  Chief Complaint Headache and Generalized Body Aches    HPI Deanna Walton is a 40 y.o. female with a past history of anxiety who comes ED complaining of bilateral frontal headache, fatigue, body aches for the past 5 days.  She has recently been around somebody who was COVID-positive within the past 2 weeks.  No shortness of breath or cough.  No chest pain.  No vomiting or diarrhea.  She is eating normally.  Symptoms are waxing and waning, no aggravating or alleviating factors.  No dizziness  Also notes that she had a tick bite a month ago.  Denies any rash or any symptoms around that time.    Past Medical History:  Diagnosis Date   Anxiety      There are no problems to display for this patient.    Past Surgical History:  Procedure Laterality Date   OPEN REDUCTION INTERNAL FIXATION (ORIF) DISTAL RADIAL FRACTURE Right 03/02/2019   Procedure: OPEN REDUCTION INTERNAL FIXATION (ORIF) DISTAL RADIAL FRACTURE;  Surgeon: Kennedy Bucker, MD;  Location: [facility] ORS;  Service: Orthopedics;  Laterality: Right;     Prior to Admission medications   Not on File     Allergies Patient has no known allergies.   History reviewed. No pertinent family history.  Social History Social History   Tobacco Use   Smoking status: Some Days    Pack years: 0.00    Types: Cigarettes   Smokeless tobacco: Never  Vaping Use   Vaping Use: Every day  Substance Use Topics   Alcohol use: No   Drug use: No    Review of Systems  Constitutional:   No fever or chills.  ENT:   No sore throat. No rhinorrhea. Cardiovascular:   No chest pain or syncope. Respiratory:   No dyspnea or cough. Gastrointestinal:   Negative for abdominal pain, vomiting and diarrhea.  Musculoskeletal:    Negative for focal pain or swelling All other systems reviewed and are negative except as documented above in ROS and HPI.  ____________________________________________   PHYSICAL EXAM:  VITAL SIGNS: ED Triage Vitals  Enc Vitals Group     BP 09/16/20 1015 (!) 137/91     Pulse Rate 09/16/20 1015 78     Resp 09/16/20 1015 17     Temp 09/16/20 1015 98.3 F (36.8 C)     Temp Source 09/16/20 1015 Oral     SpO2 09/16/20 1015 100 %     Weight 09/16/20 1017 163 lb (73.9 kg)     Height 09/16/20 1017 5\' 3"  (1.6 m)     Head Circumference --      Peak Flow --      Pain Score 09/16/20 1017 0     Pain Loc --      Pain Edu? --      Excl. in GC? --     Vital signs reviewed, nursing assessments reviewed.   Constitutional:   Alert and oriented. Non-toxic appearance. Eyes:   Conjunctivae are normal. EOMI. PERRL. ENT      Head:   Normocephalic and atraumatic.      Nose:   Normal      Mouth/Throat:   Normal, moist mucosa      Neck:   No meningismus. Full ROM. Hematological/Lymphatic/Immunilogical:   No cervical lymphadenopathy. Cardiovascular:  RRR. Symmetric bilateral radial and DP pulses.  No murmurs. Cap refill less than 2 seconds. Respiratory:   Normal respiratory effort without tachypnea/retractions. Breath sounds are clear and equal bilaterally. No wheezes/rales/rhonchi. Gastrointestinal:   Soft and nontender. Non distended. There is no CVA tenderness.  No rebound, rigidity, or guarding. Genitourinary:   deferred Musculoskeletal:   Normal range of motion in all extremities. No joint effusions.  No lower extremity tenderness.  No edema. Neurologic:   Normal speech and language.  Motor grossly intact. No acute focal neurologic deficits are appreciated.  Skin:    Skin is warm, dry and intact. No rash noted.  No petechiae, purpura, or bullae.  ____________________________________________    LABS (pertinent positives/negatives) (all labs ordered are listed, but only abnormal  results are displayed) Labs Reviewed  COMPREHENSIVE METABOLIC PANEL - Abnormal; Notable for the following components:      Result Value   Glucose, Bld 100 (*)    All other components within normal limits  CK - Abnormal; Notable for the following components:   Total CK 31 (*)    All other components within normal limits  RESP PANEL BY RT-PCR (FLU A&B, COVID) ARPGX2  CBC WITH DIFFERENTIAL/PLATELET  TSH  LYME DISEASE SEROLOGY W/REFLEX  ROCKY MTN SPOTTED FVR ABS PNL(IGG+IGM)   ____________________________________________   EKG    ____________________________________________    RADIOLOGY  No results found.  ____________________________________________   PROCEDURES Procedures  ____________________________________________    CLINICAL IMPRESSION / ASSESSMENT AND PLAN / ED COURSE  Medications ordered in the ED: Medications - No data to display  Pertinent labs & imaging results that were available during my care of the patient were reviewed by me and considered in my medical decision making (see chart for details).  Deanna Walton was evaluated in Emergency Department on 09/16/2020 for the symptoms described in the history of present illness. She was evaluated in the context of the global COVID-19 pandemic, which necessitated consideration that the patient might be at risk for infection with the SARS-CoV-2 virus that causes COVID-19. Institutional protocols and algorithms that pertain to the evaluation of patients at risk for COVID-19 are in a state of rapid change based on information released by regulatory bodies including the CDC and federal and state organizations. These policies and algorithms were followed during the patient's care in the ED.   Patient presents with malaise and fatigue for several days.  Vital signs are normal, exam is benign and reassuring.  Labs unremarkable without evidence of AKI, electrolyte antibody, anemia, COVID or flu, hypothyroidism,  rhabdomyolysis.  Recommend follow-up with PCP for further evaluation.  She stable for discharge.      ____________________________________________   FINAL CLINICAL IMPRESSION(S) / ED DIAGNOSES    Final diagnoses:  Malaise and fatigue     ED Discharge Orders     None       Portions of this note were generated with dragon dictation software. Dictation errors may occur despite best attempts at proofreading.   Sharman Cheek, MD 09/16/20 [phone]

## 2020-09-20 LAB — ROCKY MTN SPOTTED FVR ABS PNL(IGG+IGM)
RMSF IgG: NEGATIVE
RMSF IgM: 1.23 index — ABNORMAL HIGH (ref 0.00–0.89)

## 2020-09-21 ENCOUNTER — Telehealth: Payer: Self-pay | Admitting: Emergency Medicine

## 2021-01-16 IMAGING — CR DG WRIST 2V*R*
1 series · 3 of 3 positions shown · non-contrast
Comparison: None.

CLINICAL DATA: Pain

EXAM:
RIGHT WRIST - 2 VIEW

[Series 1: dg forearm right · 0.14mm/px · 3 of 3 slices shown]
[im 1/3]
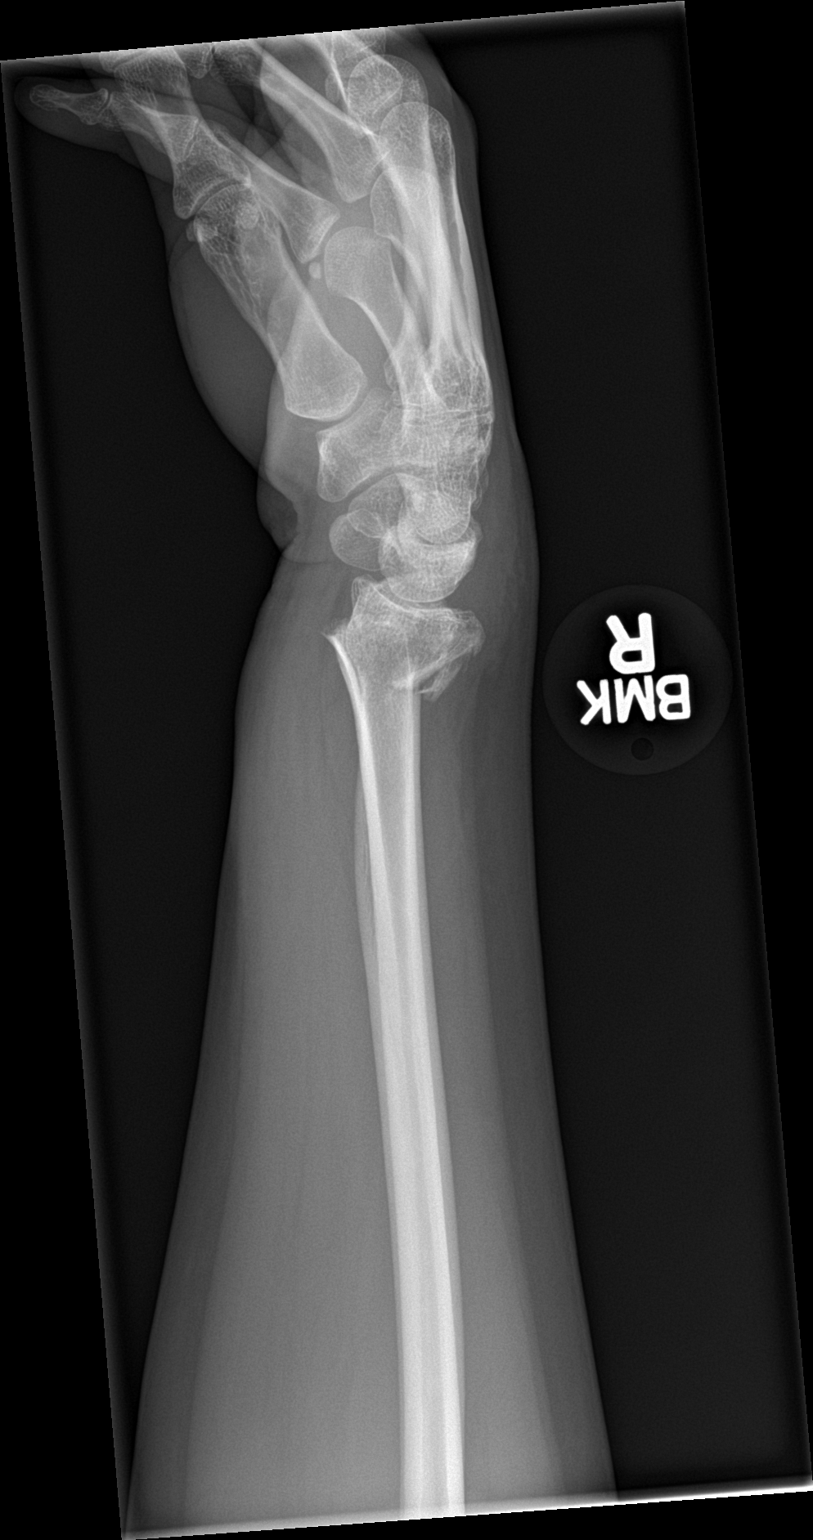
[im 2/3]
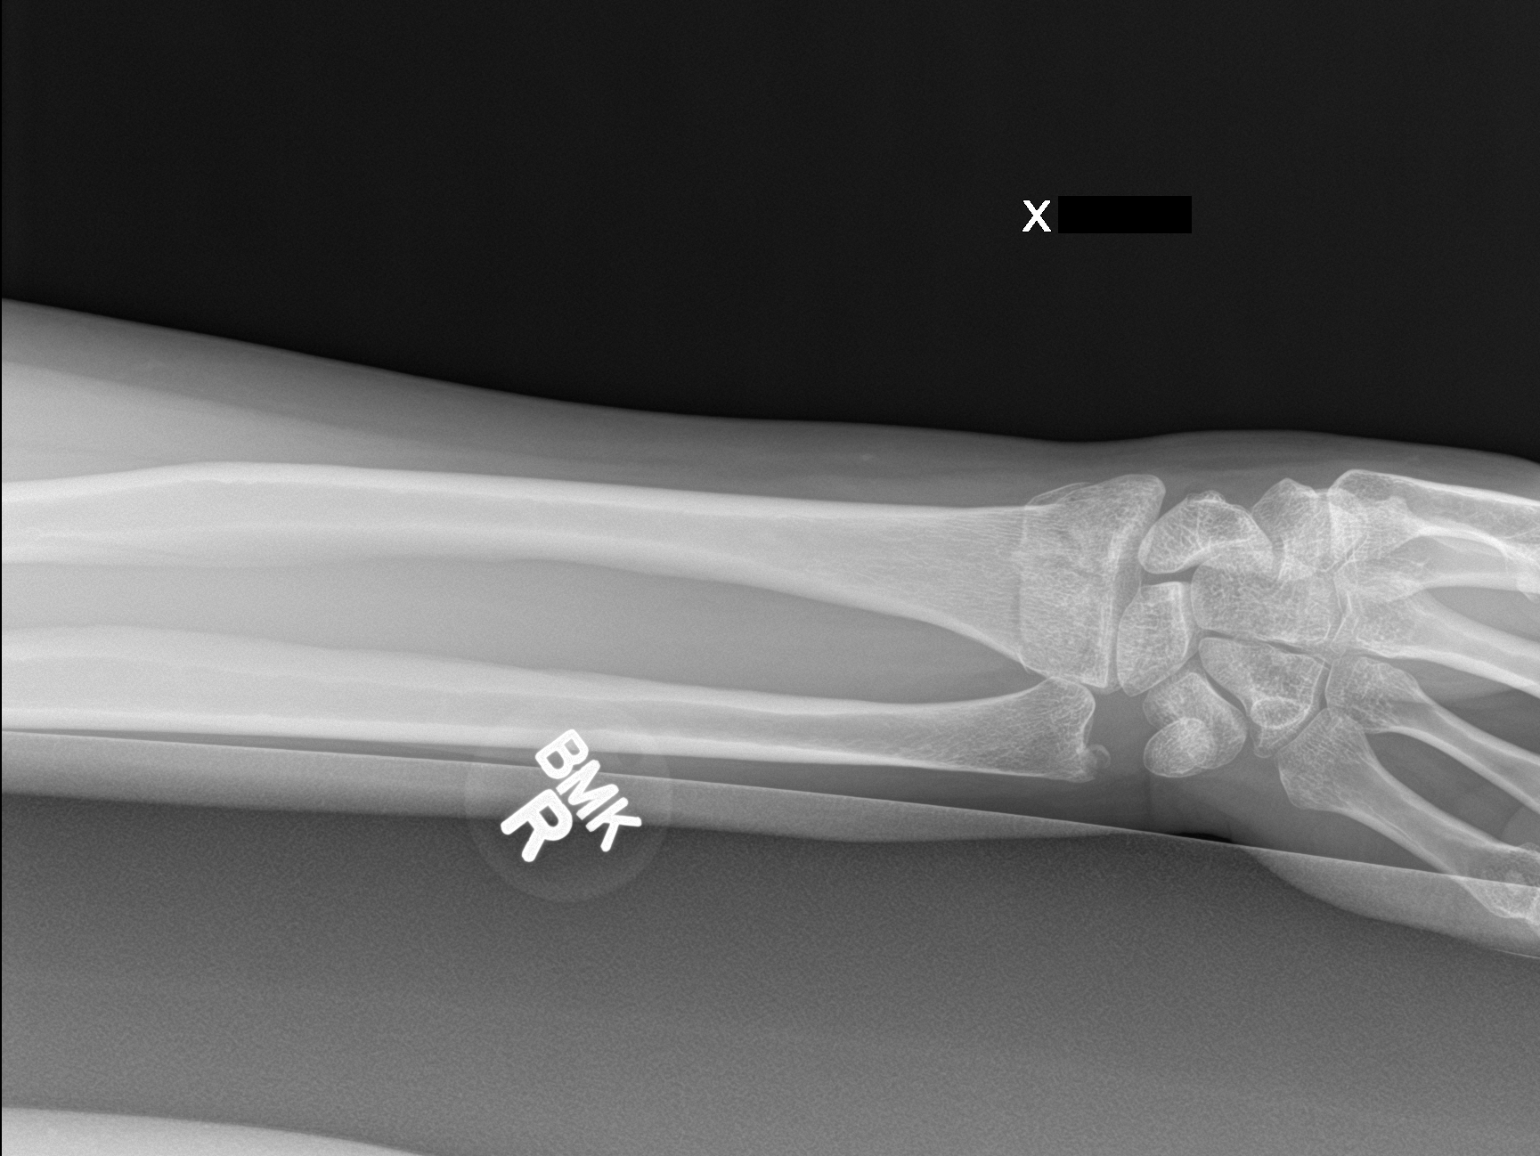
[im 3/3]
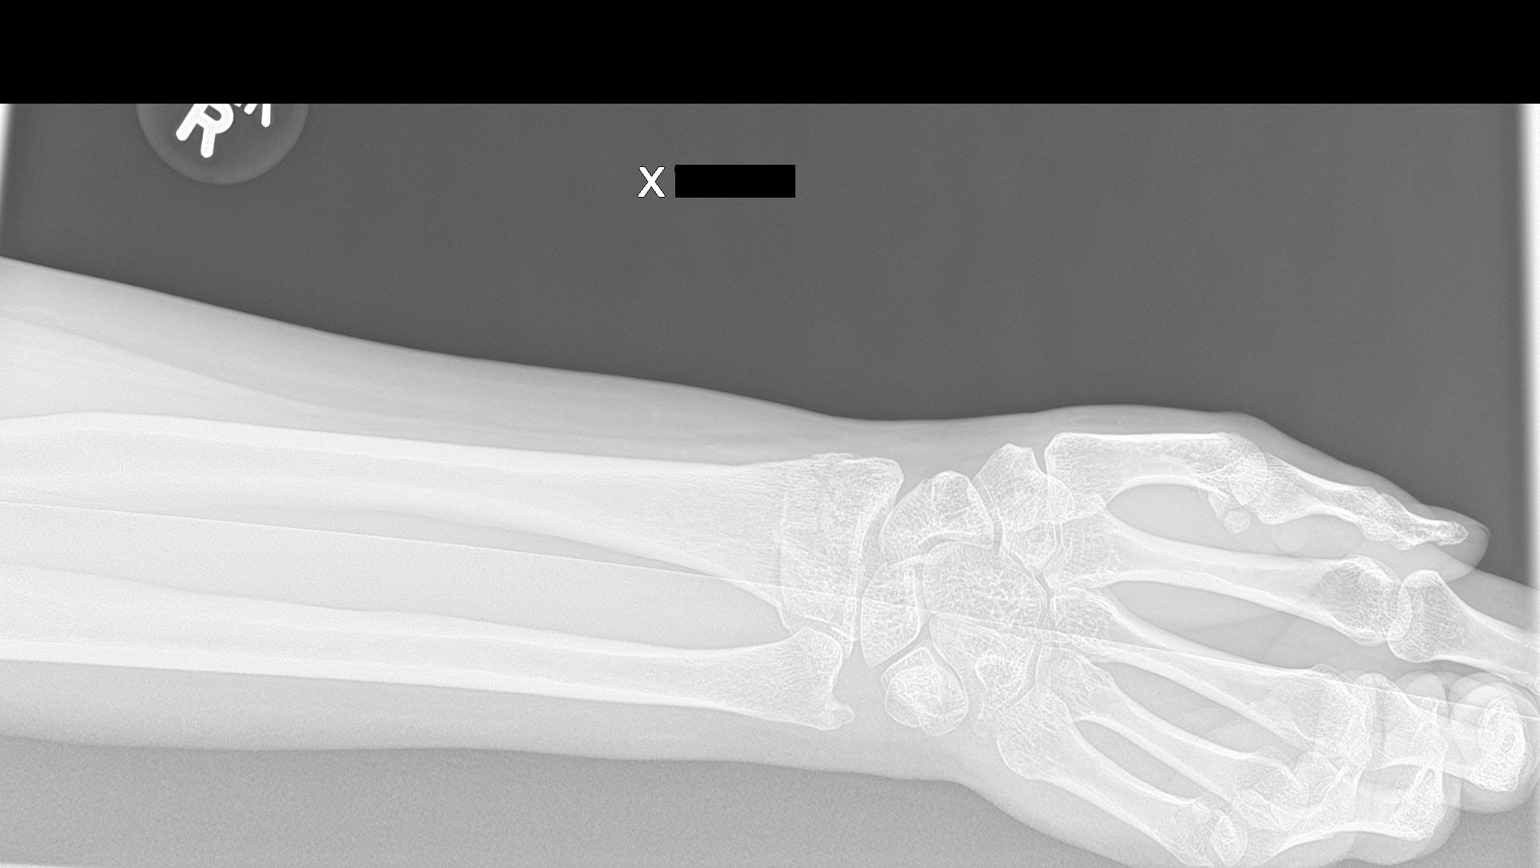

[3 of 3 positions shown; findings below may reference images not displayed]

FINDINGS: There is an acute displaced intra-articular fracture of the distal
radius. There is significant dorsal angulation of the distal
fracture fragment. There is a mildly displaced fracture of the ulnar
styloid process. There is surrounding soft tissue swelling.
IMPRESSION: 1. Acute intra-articular fracture of the distal radius with
significant dorsal angulation of the distal fracture fragment.
2. Mildly displaced fracture of the ulnar styloid process.

## 2021-01-16 IMAGING — CT CT HEAD W/O CM
3 series · 16 of 44 positions shown, 19 images · non-contrast
Comparison: None.

CLINICAL DATA: Fall.

EXAM:
CT HEAD WITHOUT CONTRAST
TECHNIQUE: Contiguous axial images were obtained from the base of the skull
through the vertex without intravenous contrast.

[Series 2: head wo · axial · 0.39mm/px · z∈[-73,+37]mm · 10 of 27 slices shown, 13 images]
[im 3/27  brain]
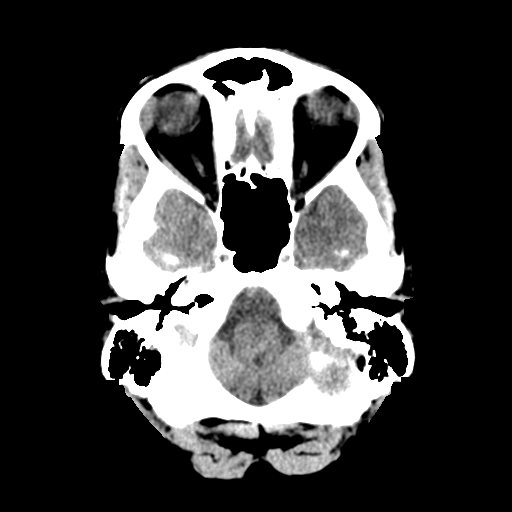
[im 3/27  bone]
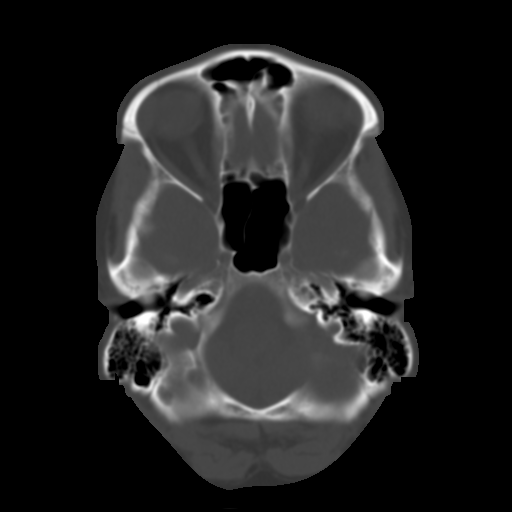
[im 5/27  brain]
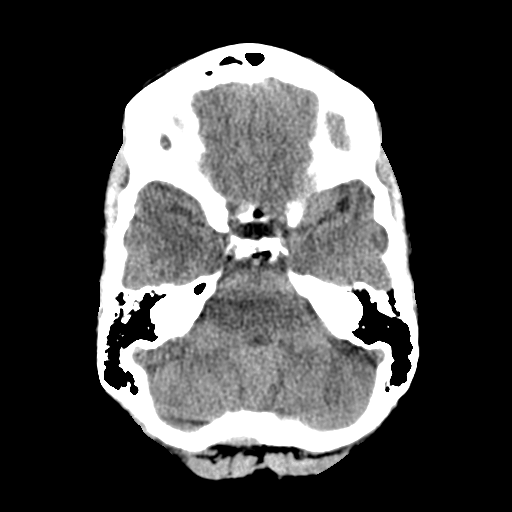
[im 8/27  brain]
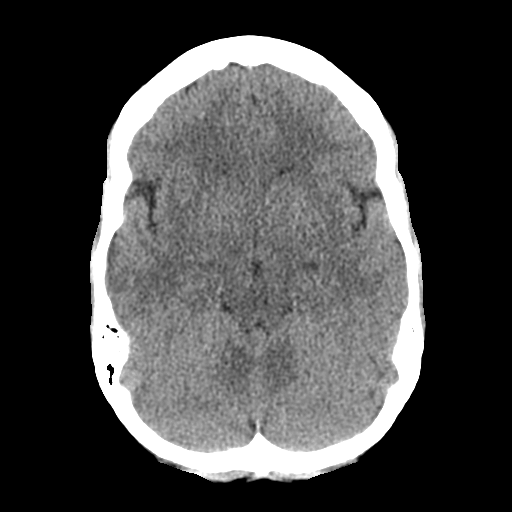
[im 10/27  brain]
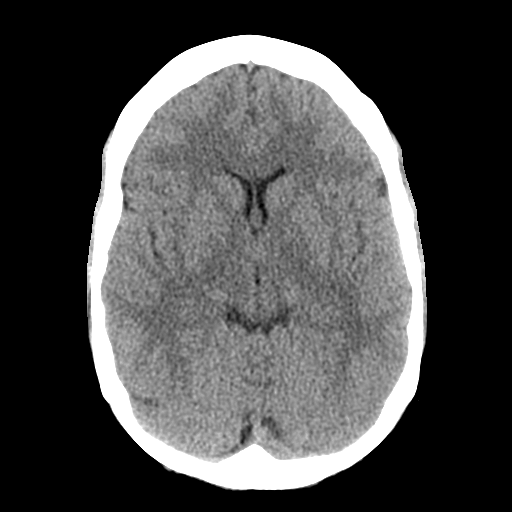
[im 13/27  brain]
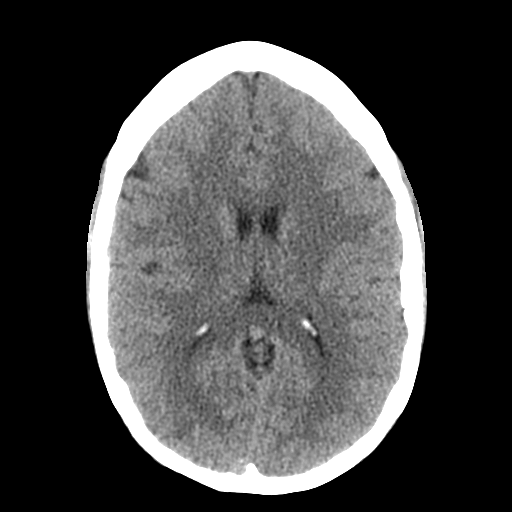
[im 13/27  bone]
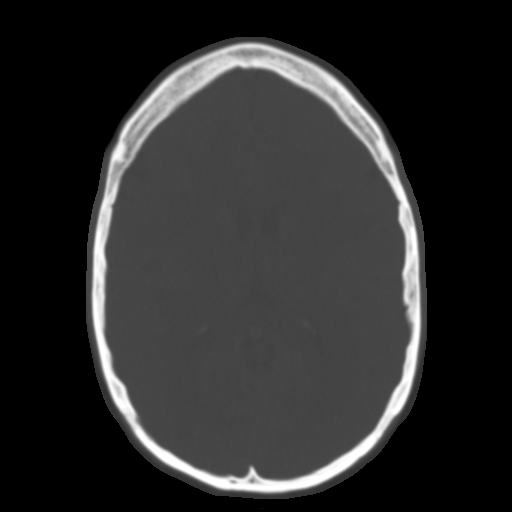
[im 15/27  brain]
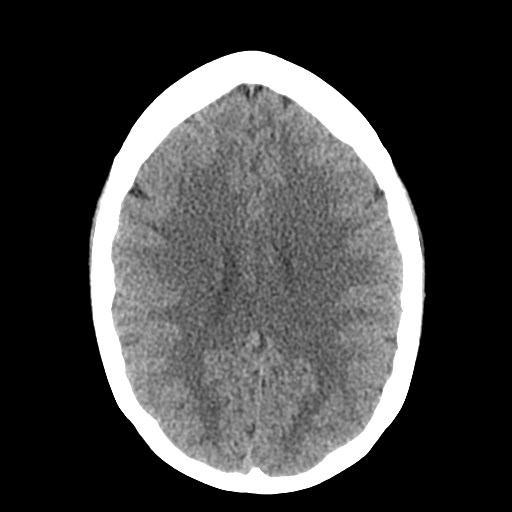
[im 18/27  brain]
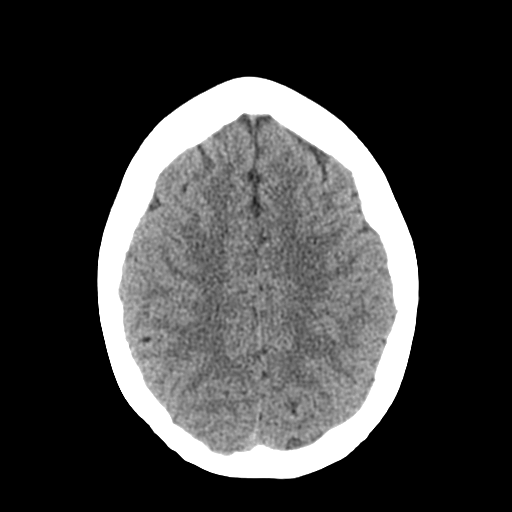
[im 20/27  brain]
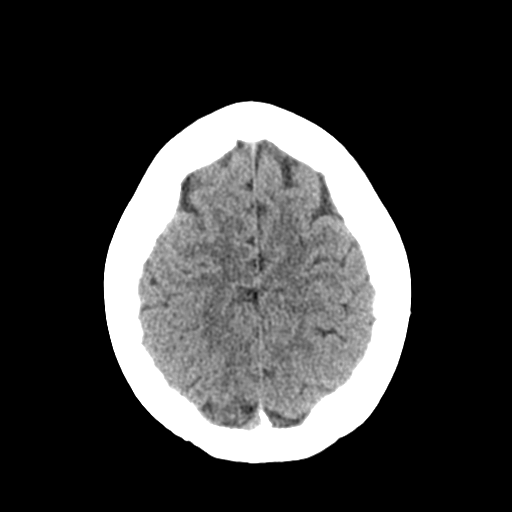
[im 23/27  brain]
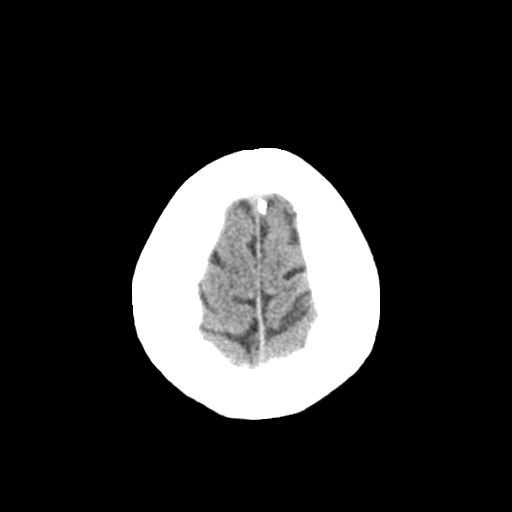
[im 23/27  bone]
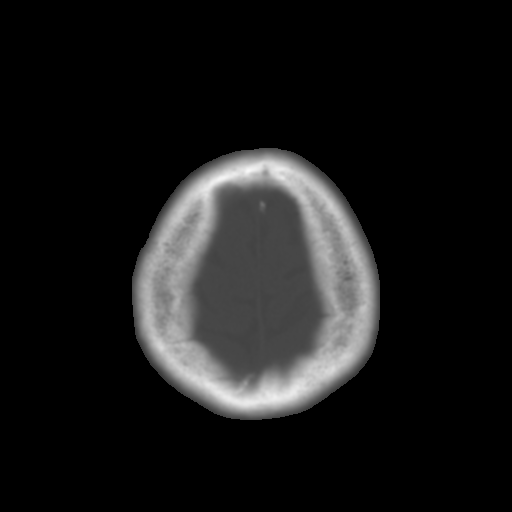
[im 25/27  brain]
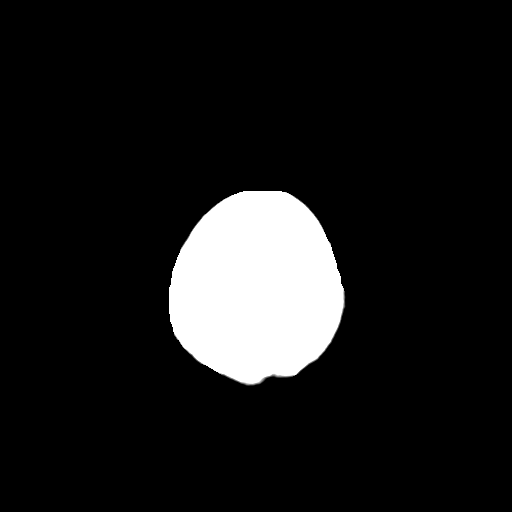

[Series 4: coronal soft tissue · coronal · 0.27mm/px · 3 of 60 slices shown]
[im 20/60  brain]
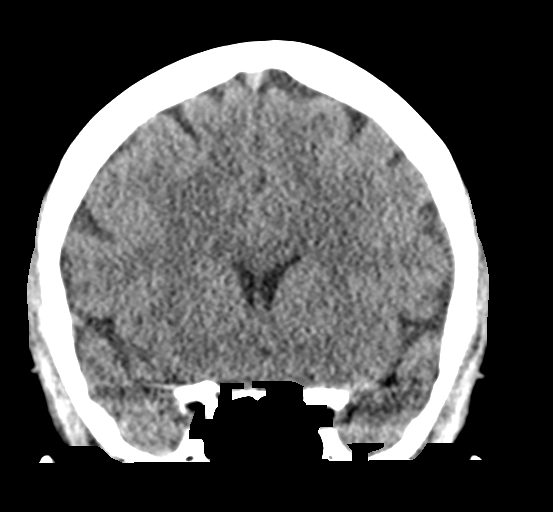
[im 27/60  brain]
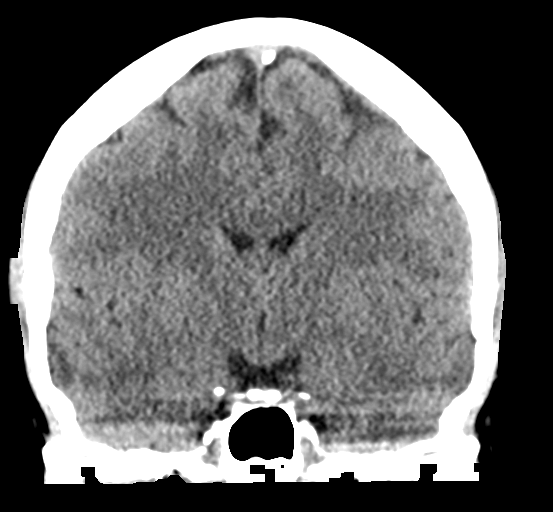
[im 33/60  brain]
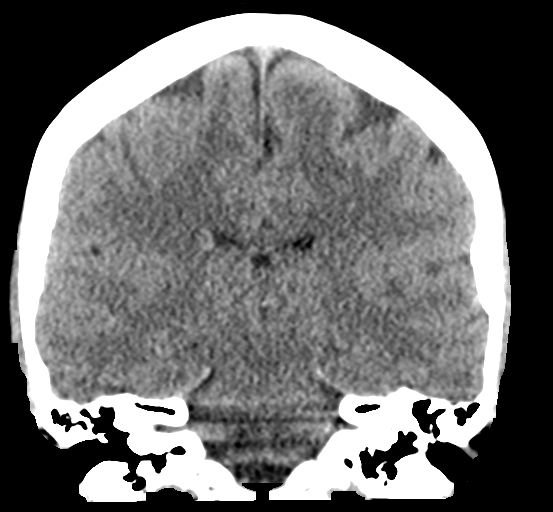

[Series 5: sagittal soft tissue · sagittal · 0.29mm/px · 3 of 46 slices shown]
[im 16/46  brain]
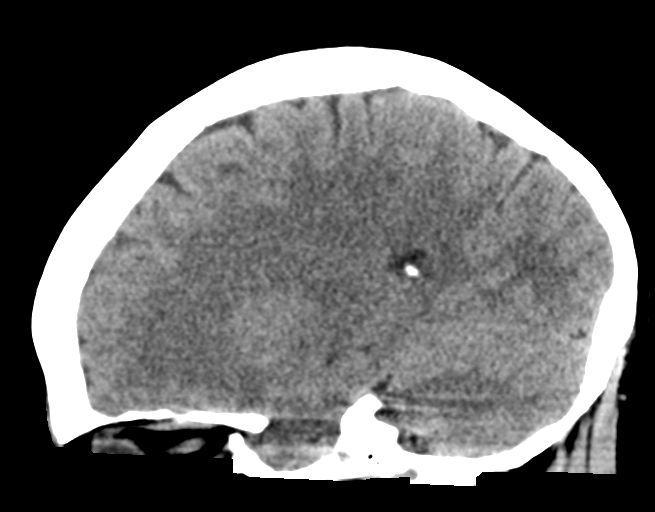
[im 23/46  brain]
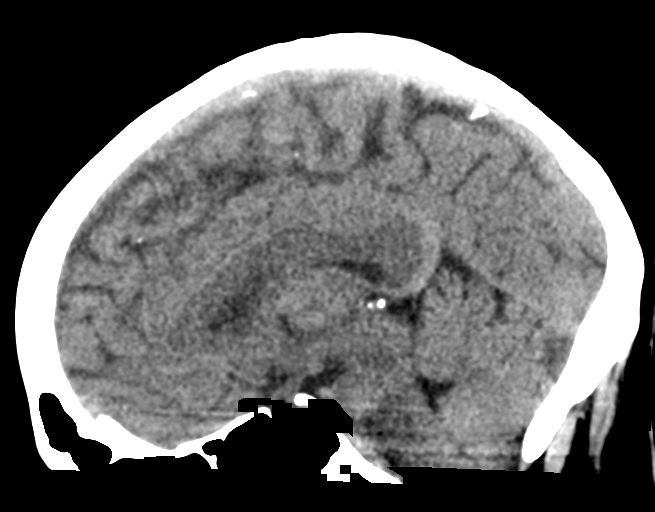
[im 31/46  brain]
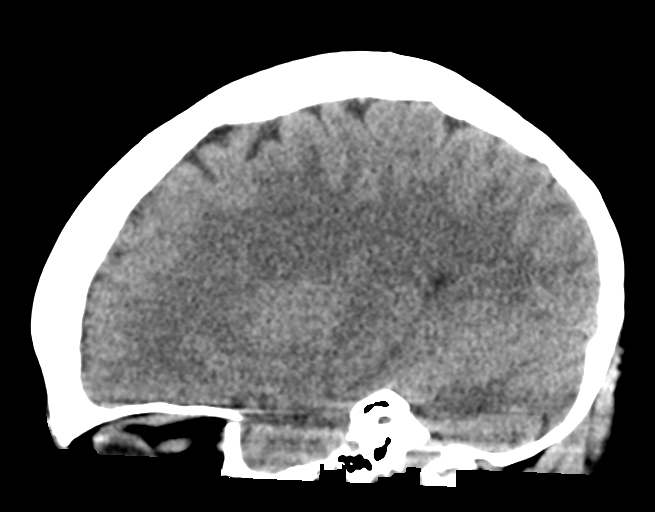

[16 of 44 positions shown; findings below may reference images not displayed]

FINDINGS: Brain: No acute intracranial hemorrhage. No focal mass lesion. No CT
evidence of acute infarction. No midline shift or mass effect. No
hydrocephalus. Basilar cisterns are patent.

Vascular: No hyperdense vessel or unexpected calcification.

Skull: Normal. Negative for fracture or focal lesion.

Sinuses/Orbits: Paranasal sinuses and mastoid air cells are clear.
Orbits are clear.

Other: None.
IMPRESSION: Normal head CT

## 2021-03-26 ENCOUNTER — Ambulatory Visit (INDEPENDENT_AMBULATORY_CARE_PROVIDER_SITE_OTHER): Payer: BC Managed Care – PPO | Admitting: Internal Medicine

## 2021-03-26 ENCOUNTER — Ambulatory Visit: Payer: Self-pay | Admitting: Internal Medicine

## 2021-03-26 ENCOUNTER — Encounter: Payer: Self-pay | Admitting: Internal Medicine

## 2021-03-26 ENCOUNTER — Other Ambulatory Visit: Payer: Self-pay

## 2021-03-26 DIAGNOSIS — R5383 Other fatigue: Secondary | ICD-10-CM | POA: Diagnosis not present

## 2021-03-26 NOTE — Progress Notes (Signed)
[place]      Reason for Consult:fatigue    Referring Physician:  Dr. Arelia Sneddon    Patient ID: Deanna Walton, female    DOB: March 19, 1980, 41 y.o.   MRN: [id number]  HPI:   Here for evaluation of fatigue and concern for [place] spotted fever.   She relates a history of a tick bite in May 2022 with no significant findings of rash, no headache, no fever and no other signs of concern at that time but then in early July developed new onset profound fatigue.  No fever, no rash, no new reported tick bites or other exposures.  She had some mild headaches as well, which have persisted.  She saw her primary doctors office and due to the tick bite, had RMSF serology sent and was weakly positive for IgM.  She was having no fever, no spots/rash.  She was given doxycycline for treatment of the low likelihood of infection and improved.  Later though she developed similar symptoms and retested and IgM again weakly positive  with negative IgG.  Again, no significant headache, no fever, no rash.  She was then given 14 days of doxycycline which she finishes today.  She was also tested for Lyme disease, though does not report being in a Lyme endemic area.     Past Medical History:  Diagnosis Date   Anxiety     Prior to Admission medications   Medication Sig Start Date End Date Taking? Authorizing Provider  Cholecalciferol (VITAMIN D3) 1.25 MG (50000 UT) CAPS Take by mouth.   Yes [provider]    No Known Allergies  Social History   Tobacco Use   Smoking status: Never    Passive exposure: Never   Smokeless tobacco: Never  Vaping Use   Vaping Use: Every day   Start date: 03/26/2020  Substance Use Topics   Alcohol use: No   Drug use: No   [place]; no rheumatic disease  Review of Systems  Constitutional: negative for fevers, chills, anorexia, and weight loss Gastrointestinal: negative for nausea and diarrhea Integument/breast: negative for  rash Musculoskeletal: positive for arthritis while taking doxycycline, negative for myalgias and arthralgias All other systems reviewed and are negative    Constitutional: in no apparent distress  Vitals:   03/26/21 1445  BP: 103/72  Pulse: 71  Resp: 16  SpO2: 100%   EYES: anicteric ENMT: no thrush Respiratory: normal respiratory effort Musculoskeletal: no edema Skin: no rash Neuro: non-focal  Labs: Lab Results  Component Value Date   WBC 8.2 09/16/2020   HGB 13.1 09/16/2020   HCT 37.6 09/16/2020   MCV 92.4 09/16/2020   PLT 279 09/16/2020    Lab Results  Component Value Date   CREATININE 0.71 09/16/2020   BUN 10 09/16/2020   NA 135 09/16/2020   K 3.9 09/16/2020   CL 103 09/16/2020   CO2 22 09/16/2020    Lab Results  Component Value Date   ALT 27 09/16/2020   AST 21 09/16/2020   ALKPHOS 58 09/16/2020   BILITOT 0.6 09/16/2020     Assessment: fatigue of unknown origin.  I discussed RMSF and symptoms, incubation period and outcomes.  Does not fit with her clinical syndrome which is absent fever, headache or rash.  Also the timing does not fit.  No further work up from [place] standpoint indicated. She will follow up with her primary office for further work up considerations for a non-infectious etiology.  Thanks for the referral and will follow up as needed  Plan: 1)  stop doxycycline

## 2023-02-24 NOTE — ED Triage Notes (Signed)
 Pt slipped in a restaurant and injured left ankle. Ankle is swollen and painful. Pt is unable to bare weight on left foot. ROM still intact.

## 2024-03-22 ENCOUNTER — Emergency Department
Admission: EM | Admit: 2024-03-22 | Discharge: 2024-03-22 | Disposition: A | Discharge status: Home / Self Care | Payer: Self-pay | Attending: Emergency Medicine | Admitting: Emergency Medicine

## 2024-03-22 ENCOUNTER — Encounter: Payer: Self-pay | Admitting: Emergency Medicine

## 2024-03-22 ENCOUNTER — Other Ambulatory Visit: Payer: Self-pay

## 2024-03-22 DIAGNOSIS — R002 Palpitations: Secondary | ICD-10-CM | POA: Insufficient documentation

## 2024-03-22 DIAGNOSIS — M79651 Pain in right thigh: Secondary | ICD-10-CM | POA: Insufficient documentation

## 2024-03-22 DIAGNOSIS — K0889 Other specified disorders of teeth and supporting structures: Secondary | ICD-10-CM | POA: Insufficient documentation

## 2024-03-22 DIAGNOSIS — M79652 Pain in left thigh: Secondary | ICD-10-CM | POA: Insufficient documentation

## 2024-03-22 DIAGNOSIS — R42 Dizziness and giddiness: Secondary | ICD-10-CM | POA: Insufficient documentation

## 2024-03-22 LAB — CBC WITH DIFFERENTIAL/PLATELET
Abs Immature Granulocytes: 0.03 K/uL (ref 0.00–0.07)
Basophils Absolute: 0 K/uL (ref 0.0–0.1)
Basophils Relative: 1 %
Eosinophils Absolute: 0.2 K/uL (ref 0.0–0.5)
Eosinophils Relative: 2 %
HCT: 34.9 % — ABNORMAL LOW (ref 36.0–46.0)
Hemoglobin: 11.8 g/dL — ABNORMAL LOW (ref 12.0–15.0)
Immature Granulocytes: 0 %
Lymphocytes Relative: 31 %
Lymphs Abs: 2.6 K/uL (ref 0.7–4.0)
MCH: 31.2 pg (ref 26.0–34.0)
MCHC: 33.8 g/dL (ref 30.0–36.0)
MCV: 92.3 fL (ref 80.0–100.0)
Monocytes Absolute: 0.6 K/uL (ref 0.1–1.0)
Monocytes Relative: 8 %
Neutro Abs: 5 K/uL (ref 1.7–7.7)
Neutrophils Relative %: 58 %
Platelets: 273 K/uL (ref 150–400)
RBC: 3.78 MIL/uL — ABNORMAL LOW (ref 3.87–5.11)
RDW: 13.1 % (ref 11.5–15.5)
WBC: 8.4 K/uL (ref 4.0–10.5)
nRBC: 0 % (ref 0.0–0.2)

## 2024-03-22 LAB — COMPREHENSIVE METABOLIC PANEL WITH GFR
ALT: 25 U/L (ref 0–44)
AST: 16 U/L (ref 15–41)
Albumin: 4.2 g/dL (ref 3.5–5.0)
Alkaline Phosphatase: 48 U/L (ref 38–126)
Anion gap: 10 (ref 5–15)
BUN: 10 mg/dL (ref 6–20)
CO2: 25 mmol/L (ref 22–32)
Calcium: 9 mg/dL (ref 8.9–10.3)
Chloride: 104 mmol/L (ref 98–111)
Creatinine, Ser: 0.66 mg/dL (ref 0.44–1.00)
GFR, Estimated: >60 mL/min
Glucose, Bld: 102 mg/dL — ABNORMAL HIGH (ref 70–99)
Potassium: 3.8 mmol/L (ref 3.5–5.1)
Sodium: 138 mmol/L (ref 135–145)
Total Bilirubin: 0.3 mg/dL (ref 0.0–1.2)
Total Protein: 6.8 g/dL (ref 6.5–8.1)

## 2024-03-22 LAB — MAGNESIUM: Magnesium: 2 mg/dL (ref 1.7–2.4)

## 2024-03-22 LAB — CK: Total CK: 33 U/L — ABNORMAL LOW (ref 38–234)

## 2024-03-22 LAB — TSH: TSH: 2.23 u[IU]/mL (ref 0.350–4.500)

## 2024-03-22 LAB — HCG, QUANTITATIVE, PREGNANCY: hCG, Beta Chain, Quant, S: <1 m[IU]/mL

## 2024-03-22 LAB — TROPONIN T, HIGH SENSITIVITY: Troponin T High Sensitivity: <15 ng/L (ref 0–19)

## 2024-03-22 LAB — D-DIMER, QUANTITATIVE: D-Dimer, Quant: <0.27 ug{FEU}/mL (ref 0.00–0.50)

## 2024-03-22 MED ORDER — KETOROLAC TROMETHAMINE 30 MG/ML IJ SOLN
30.0000 mg | Freq: Once | INTRAMUSCULAR | Status: AC
  Administered 2024-03-22: 30 mg via INTRAVENOUS
  Filled 2024-03-22: qty 1

## 2024-03-22 MED ORDER — MECLIZINE HCL 25 MG PO TABS: 25.0000 mg | ORAL_TABLET | Freq: Three times a day (TID) | ORAL | 0 refills | Status: AC | PRN

## 2024-03-22 MED ORDER — MECLIZINE HCL 25 MG PO TABS
25.0000 mg | ORAL_TABLET | Freq: Once | ORAL | Status: AC
  Administered 2024-03-22: 25 mg via ORAL
  Filled 2024-03-22: qty 1

## 2024-03-22 MED ORDER — SODIUM CHLORIDE 0.9 % IV BOLUS (SEPSIS)
1000.0000 mL | Freq: Once | INTRAVENOUS | Status: AC
  Administered 2024-03-22: 1000 mL via INTRAVENOUS

## 2024-03-22 NOTE — ED Triage Notes (Signed)
 Pt arrived via POV with reports of leg pain for several days, states the pain radiates down L leg then switched to R leg.  Pt c/o low back pain and irregular period.  Pt states she was in the bathub and felt dizzy like she was going to pass out.  Pt states she has had a hx of vertigo when she has been sick.

## 2024-03-22 NOTE — Discharge Instructions (Signed)
 You may alternate over the counter Tylenol 1000 mg every 6 hours as needed for pain, fever and Ibuprofen 800 mg every 6-8 hours as needed for pain, fever.  Please take Ibuprofen with food.  Do not take more than 4000 mg of Tylenol (acetaminophen) in a 24 hour period.

## 2024-03-22 NOTE — ED Notes (Signed)
 AVS provided by edp was reviewed with pt. Pt verbalized understanding of instructions. Pt confirmed pharmacy. Pt ambulatory without distress at time of discharge.

## 2024-03-22 NOTE — ED Provider Notes (Signed)
 "  [facility] Provider Note    Event Date/Time   First MD Initiated Contact with Patient 03/22/24 [phone]     (approximate)   History   Dizziness, Leg Pain, Back Pain, and Vaginal Bleeding   HPI  Deanna Walton is a 44 y.o. female with history of anxiety, migraines who presents to the emergency department with concern for multiple complaints.  States for the past couple of weeks she has had left thigh pain that is now also in the right thigh.  Denies any injury to her legs.  No calf tenderness or calf swelling.  No history of PE or DVT.  Has been taking ibuprofen  and tried to soak in a warm bath tonight without much relief.  No overlying skin changes.  Also reports night and intermittently for the past couple of days she has had palpitations.  No chest pain or shortness of breath.  She also reports having vertigo tonight while in the bathtub.  Felt like the room was spinning.  She has also had intermittent headaches and has a history of migraines.  Also complaining of right-sided dental pain.  States it was swollen but she took leftover metronidazole that she had which she felt improved her symptoms.  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  She denies any nausea, vomiting.  Has chronic diarrhea which is unchanged.  She states that she is currently on her menstrual cycle.  She was concerned though that she could be pregnant.  She is sexually active with 1 female partner.  She also states that she is wondering if she could be perimenopausal.   History provided by patient.    Past Medical History:  Diagnosis Date   Anxiety     Past Surgical History:  Procedure Laterality Date   OPEN REDUCTION INTERNAL FIXATION (ORIF) DISTAL RADIAL FRACTURE Right 03/02/2019   Procedure: OPEN REDUCTION INTERNAL FIXATION (ORIF) DISTAL RADIAL FRACTURE;  Surgeon: Kathlynn Sharper, MD;  Location: [facility] ORS;  Service: Orthopedics;   Laterality: Right;    MEDICATIONS:  Prior to Admission medications  Medication Sig Start Date End Date Taking? Authorizing Provider  Cholecalciferol (VITAMIN D3) 1.25 MG (50000 UT) CAPS Take by mouth.    [provider]    Physical Exam   Triage Vital Signs: ED Triage Vitals  Encounter Vitals Group     BP 03/22/24 0424 120/75     Girls Systolic BP Percentile --      Girls Diastolic BP Percentile --      Boys Systolic BP Percentile --      Boys Diastolic BP Percentile --      Pulse Rate 03/22/24 0424 72     Resp 03/22/24 0424 16     Temp 03/22/24 0424 98.4 F (36.9 C)     Temp Source 03/22/24 0424 Oral     SpO2 03/22/24 0424 100 %     Weight 03/22/24 0421 163 lb (73.9 kg)     Height 03/22/24 0421 5' 4 (1.626 m)     Head Circumference --      Peak Flow --      Pain Score 03/22/24 0421 5     Pain Loc --      Pain Education --      Exclude from Growth Chart --     Most recent vital signs: Vitals:   03/22/24 0424 03/22/24 0518  BP: 120/75 116/79  Pulse: 72 (!) 51  Resp: 16 10  Temp: 98.4 F (36.9 C)   SpO2: 100% 100%    CONSTITUTIONAL: Alert, responds appropriately to questions. Well-appearing; well-nourished HEAD: Normocephalic, atraumatic EYES: Conjunctivae clear, pupils appear equal, sclera nonicteric ENT: normal nose; moist mucous membranes, fractured right upper molar without gum swelling, no trismus or drooling, normal phonation, no tonsillar hypertrophy or exudate, no stridor NECK: Supple, normal ROM CARD: RRR; S1 and S2 appreciated RESP: Normal chest excursion without splinting or tachypnea; breath sounds clear and equal bilaterally; no wheezes, no rhonchi, no rales, no hypoxia or respiratory distress, speaking full sentences ABD/GI: Non-distended; soft, non-tender, no rebound, no guarding, no peritoneal signs BACK: The back appears normal, no midline spinal tenderness or step-off or deformity EXT: Normal ROM in all joints; no deformity noted, no  edema, no tenderness or deformity noted, 2+ DP pulses bilaterally, compartments in both legs are soft SKIN: Normal color for age and race; warm; no rash on exposed skin NEURO: Moves all extremities equally, normal speech, normal sensation, no facial asymmetry PSYCH: The patient's mood and manner are appropriate.   ED Results / Procedures / Treatments   LABS: (all labs ordered are listed, but only abnormal results are displayed) Labs Reviewed  CBC WITH DIFFERENTIAL/PLATELET - Abnormal; Notable for the following components:      Result Value   RBC 3.78 (*)    Hemoglobin 11.8 (*)    HCT 34.9 (*)    All other components within normal limits  COMPREHENSIVE METABOLIC PANEL WITH GFR - Abnormal; Notable for the following components:   Glucose, Bld 102 (*)    All other components within normal limits  CK - Abnormal; Notable for the following components:   Total CK 33 (*)    All other components within normal limits  TSH  MAGNESIUM  HCG, QUANTITATIVE, PREGNANCY  D-DIMER, QUANTITATIVE  TROPONIN T, HIGH SENSITIVITY  TROPONIN T, HIGH SENSITIVITY     EKG:  EKG Interpretation Date/Time:  Wednesday March 22 2024 05:17:45 EST Ventricular Rate:  53 PR Interval:  173 QRS Duration:  83 QT Interval:  432 QTC Calculation: 406 R Axis:   70  Text Interpretation: Sinus rhythm Low voltage, precordial leads Confirmed by Neomi Neptune [phone]) on 03/22/2024 5:19:03 AM         RADIOLOGY: My personal review and interpretation of imaging:    I have personally reviewed all radiology reports.   No results found.   PROCEDURES:  Critical Care performed: No    .1-3 Lead EKG Interpretation  Performed by: Nollie Terlizzi, Neptune SAILOR, DO Authorized by: Elexia Friedt, Neptune SAILOR, DO     Interpretation: normal     ECG rate:  72   ECG rate assessment: normal     Rhythm: sinus rhythm     Ectopy: none     Conduction: normal       IMPRESSION / MDM / ASSESSMENT AND PLAN / ED COURSE  I reviewed the triage  vital signs and the nursing notes.    Patient here with multiple complaints.  Complaining of palpitations, vertigo, headache, dental pain, bilateral thigh pain.  The patient is on the cardiac monitor to evaluate for evidence of arrhythmia and/or significant heart rate changes.   DIFFERENTIAL DIAGNOSIS (includes but not limited to):   Anemia, electrolyte derangement, thyroid  dysfunction, DVT, PE, lumbosacral radiculopathy, muscle strain, muscle spasm, myositis, doubt CHF, dissection, pneumothorax, pneumonia.  No sign of compartment syndrome, septic arthritis, extremity fracture.   Patient's presentation is most consistent with  acute presentation with potential threat to life or bodily function.   PLAN: Will obtain labs, give medications for symptomatic relief.   MEDICATIONS GIVEN IN ED: Medications  sodium chloride  0.9 % bolus 1,000 mL (1,000 mLs Intravenous New Bag/Given 03/22/24 0510)  meclizine  (ANTIVERT ) tablet 25 mg (25 mg Oral Given 03/22/24 0509)  ketorolac  (TORADOL ) 30 MG/ML injection 30 mg (30 mg Intravenous Given 03/22/24 0511)     ED COURSE: Patient's hemoglobin is 11.8.  Normal electrolytes, LFTs, TSH, CK.  Negative troponin and D-dimer.  Negative pregnancy test.  Patient reports feeling better.  She has a PCP for follow-up.  Will discharge with meclizine  and recommended Tylenol , Motrin , increase fluid intake and rest.  No indication for further emergent workup at this time.  No signs of dental abscess or facial swelling on exam and she feels like this has improved.  No indication to start her on antibiotics but will give dental follow-up information.  At this time, I do not feel there is any life-threatening condition present. I reviewed all nursing notes, vitals, pertinent previous records.  All lab and urine results, EKGs, imaging ordered have been independently reviewed and interpreted by myself.  I reviewed all available radiology reports from any imaging ordered this visit.   Based on my assessment, I feel the patient is safe to be discharged home without further emergent workup and can continue workup as an outpatient as needed. Discussed all findings, treatment plan as well as usual and customary return precautions.  They verbalize understanding and are comfortable with this plan.  Outpatient follow-up has been provided as needed.  All questions have been answered.    CONSULTS:  none   OUTSIDE RECORDS REVIEWED: Reviewed last orthopedic notes in 2021.       FINAL CLINICAL IMPRESSION(S) / ED DIAGNOSES   Final diagnoses:  Vertigo  Palpitations  Pain, dental  Bilateral thigh pain     Rx / DC Orders   ED Discharge Orders          Ordered    meclizine  (ANTIVERT ) 25 MG tablet  3 times daily PRN        03/22/24 0605             Note:  This document was prepared using Dragon voice recognition software and may include unintentional dictation errors.   Tomi Grandpre, Josette SAILOR, DO 03/22/24 [phone]  "
